# Patient Record
Sex: Male | Born: 1950 | Race: White | Hispanic: No | Marital: Married | Smoking: Former smoker
Health system: Southern US, Community
[De-identification: ages and names within clinical notes are randomized; demographics above are authoritative.]

## PROBLEM LIST (undated history)

## (undated) DIAGNOSIS — C3490 Malignant neoplasm of unspecified part of unspecified bronchus or lung: Secondary | ICD-10-CM

## (undated) DIAGNOSIS — G500 Trigeminal neuralgia: Secondary | ICD-10-CM

## (undated) HISTORY — PX: OTHER SURGICAL HISTORY: SHX169

---

## 2015-05-31 ENCOUNTER — Ambulatory Visit
Admission: RE | Admit: 2015-05-31 | Discharge: 2015-05-31 | Disposition: A | Discharge status: Home / Self Care | Payer: Disability Insurance | Source: Ambulatory Visit | Attending: Physical Medicine and Rehabilitation | Admitting: Physical Medicine and Rehabilitation

## 2015-05-31 ENCOUNTER — Other Ambulatory Visit: Payer: Self-pay | Admitting: Physical Medicine and Rehabilitation

## 2015-05-31 DIAGNOSIS — M170 Bilateral primary osteoarthritis of knee: Secondary | ICD-10-CM | POA: Insufficient documentation

## 2015-05-31 DIAGNOSIS — G8929 Other chronic pain: Secondary | ICD-10-CM | POA: Diagnosis present

## 2015-05-31 DIAGNOSIS — M1611 Unilateral primary osteoarthritis, right hip: Secondary | ICD-10-CM

## 2016-01-31 ENCOUNTER — Ambulatory Visit
Admission: RE | Admit: 2016-01-31 | Discharge: 2016-01-31 | Disposition: A | Discharge status: Home / Self Care | Payer: Disability Insurance | Source: Ambulatory Visit | Attending: General Practice | Admitting: General Practice

## 2016-01-31 ENCOUNTER — Other Ambulatory Visit: Payer: Self-pay | Admitting: General Practice

## 2016-01-31 DIAGNOSIS — M1990 Unspecified osteoarthritis, unspecified site: Secondary | ICD-10-CM

## 2016-01-31 DIAGNOSIS — M25562 Pain in left knee: Secondary | ICD-10-CM | POA: Diagnosis present

## 2016-01-31 DIAGNOSIS — G8929 Other chronic pain: Secondary | ICD-10-CM | POA: Insufficient documentation

## 2016-01-31 DIAGNOSIS — M545 Low back pain: Secondary | ICD-10-CM | POA: Insufficient documentation

## 2016-01-31 DIAGNOSIS — I700 Atherosclerosis of aorta: Secondary | ICD-10-CM | POA: Insufficient documentation

## 2016-01-31 DIAGNOSIS — M5136 Other intervertebral disc degeneration, lumbar region: Secondary | ICD-10-CM | POA: Diagnosis not present

## 2016-01-31 DIAGNOSIS — M170 Bilateral primary osteoarthritis of knee: Secondary | ICD-10-CM | POA: Diagnosis not present

## 2016-01-31 DIAGNOSIS — M25561 Pain in right knee: Secondary | ICD-10-CM | POA: Diagnosis present

## 2016-07-31 ENCOUNTER — Inpatient Hospital Stay: Payer: Medicaid Other

## 2016-07-31 ENCOUNTER — Inpatient Hospital Stay: Admit: 2016-07-31 | Payer: Medicaid Other

## 2016-07-31 ENCOUNTER — Encounter: Payer: Self-pay | Admitting: Emergency Medicine

## 2016-07-31 ENCOUNTER — Inpatient Hospital Stay
Admission: EM | Admit: 2016-07-31 | Discharge: 2016-08-06 | DRG: 917 | Disposition: E | Payer: Medicaid Other | Attending: Internal Medicine | Admitting: Internal Medicine

## 2016-07-31 ENCOUNTER — Emergency Department: Payer: Medicaid Other

## 2016-07-31 DIAGNOSIS — T402X2A Poisoning by other opioids, intentional self-harm, initial encounter: Secondary | ICD-10-CM | POA: Diagnosis present

## 2016-07-31 DIAGNOSIS — Z833 Family history of diabetes mellitus: Secondary | ICD-10-CM

## 2016-07-31 DIAGNOSIS — T50902D Poisoning by unspecified drugs, medicaments and biological substances, intentional self-harm, subsequent encounter: Secondary | ICD-10-CM | POA: Diagnosis not present

## 2016-07-31 DIAGNOSIS — Z923 Personal history of irradiation: Secondary | ICD-10-CM

## 2016-07-31 DIAGNOSIS — J9620 Acute and chronic respiratory failure, unspecified whether with hypoxia or hypercapnia: Secondary | ICD-10-CM | POA: Diagnosis not present

## 2016-07-31 DIAGNOSIS — T50902A Poisoning by unspecified drugs, medicaments and biological substances, intentional self-harm, initial encounter: Secondary | ICD-10-CM

## 2016-07-31 DIAGNOSIS — K219 Gastro-esophageal reflux disease without esophagitis: Secondary | ICD-10-CM | POA: Diagnosis present

## 2016-07-31 DIAGNOSIS — C787 Secondary malignant neoplasm of liver and intrahepatic bile duct: Secondary | ICD-10-CM | POA: Diagnosis present

## 2016-07-31 DIAGNOSIS — G894 Chronic pain syndrome: Secondary | ICD-10-CM | POA: Diagnosis present

## 2016-07-31 DIAGNOSIS — Z87891 Personal history of nicotine dependence: Secondary | ICD-10-CM | POA: Diagnosis not present

## 2016-07-31 DIAGNOSIS — C781 Secondary malignant neoplasm of mediastinum: Secondary | ICD-10-CM | POA: Diagnosis present

## 2016-07-31 DIAGNOSIS — Z515 Encounter for palliative care: Secondary | ICD-10-CM | POA: Diagnosis not present

## 2016-07-31 DIAGNOSIS — I4891 Unspecified atrial fibrillation: Secondary | ICD-10-CM | POA: Diagnosis present

## 2016-07-31 DIAGNOSIS — F329 Major depressive disorder, single episode, unspecified: Secondary | ICD-10-CM | POA: Diagnosis present

## 2016-07-31 DIAGNOSIS — J9621 Acute and chronic respiratory failure with hypoxia: Secondary | ICD-10-CM | POA: Diagnosis not present

## 2016-07-31 DIAGNOSIS — C3490 Malignant neoplasm of unspecified part of unspecified bronchus or lung: Secondary | ICD-10-CM | POA: Diagnosis not present

## 2016-07-31 DIAGNOSIS — J9622 Acute and chronic respiratory failure with hypercapnia: Secondary | ICD-10-CM | POA: Diagnosis not present

## 2016-07-31 DIAGNOSIS — I350 Nonrheumatic aortic (valve) stenosis: Secondary | ICD-10-CM | POA: Diagnosis not present

## 2016-07-31 DIAGNOSIS — C786 Secondary malignant neoplasm of retroperitoneum and peritoneum: Secondary | ICD-10-CM | POA: Diagnosis present

## 2016-07-31 DIAGNOSIS — I480 Paroxysmal atrial fibrillation: Secondary | ICD-10-CM | POA: Diagnosis present

## 2016-07-31 DIAGNOSIS — J9600 Acute respiratory failure, unspecified whether with hypoxia or hypercapnia: Secondary | ICD-10-CM

## 2016-07-31 DIAGNOSIS — N400 Enlarged prostate without lower urinary tract symptoms: Secondary | ICD-10-CM | POA: Diagnosis present

## 2016-07-31 DIAGNOSIS — G500 Trigeminal neuralgia: Secondary | ICD-10-CM | POA: Diagnosis present

## 2016-07-31 DIAGNOSIS — R627 Adult failure to thrive: Secondary | ICD-10-CM | POA: Diagnosis present

## 2016-07-31 DIAGNOSIS — Z66 Do not resuscitate: Secondary | ICD-10-CM | POA: Diagnosis not present

## 2016-07-31 DIAGNOSIS — B850 Pediculosis due to Pediculus humanus capitis: Secondary | ICD-10-CM | POA: Diagnosis present

## 2016-07-31 DIAGNOSIS — R0902 Hypoxemia: Secondary | ICD-10-CM

## 2016-07-31 DIAGNOSIS — C7989 Secondary malignant neoplasm of other specified sites: Secondary | ICD-10-CM | POA: Diagnosis present

## 2016-07-31 DIAGNOSIS — R0600 Dyspnea, unspecified: Secondary | ICD-10-CM

## 2016-07-31 DIAGNOSIS — I2699 Other pulmonary embolism without acute cor pulmonale: Secondary | ICD-10-CM | POA: Diagnosis present

## 2016-07-31 DIAGNOSIS — C3402 Malignant neoplasm of left main bronchus: Secondary | ICD-10-CM | POA: Diagnosis present

## 2016-07-31 DIAGNOSIS — Z6828 Body mass index (BMI) 28.0-28.9, adult: Secondary | ICD-10-CM

## 2016-07-31 DIAGNOSIS — E43 Unspecified severe protein-calorie malnutrition: Secondary | ICD-10-CM | POA: Diagnosis present

## 2016-07-31 DIAGNOSIS — T50901A Poisoning by unspecified drugs, medicaments and biological substances, accidental (unintentional), initial encounter: Secondary | ICD-10-CM | POA: Diagnosis present

## 2016-07-31 DIAGNOSIS — F4325 Adjustment disorder with mixed disturbance of emotions and conduct: Secondary | ICD-10-CM | POA: Diagnosis present

## 2016-07-31 DIAGNOSIS — T1491XA Suicide attempt, initial encounter: Secondary | ICD-10-CM

## 2016-07-31 DIAGNOSIS — R609 Edema, unspecified: Secondary | ICD-10-CM

## 2016-07-31 HISTORY — DX: Malignant neoplasm of unspecified part of unspecified bronchus or lung: C34.90

## 2016-07-31 HISTORY — DX: Trigeminal neuralgia: G50.0

## 2016-07-31 LAB — APTT: APTT: 38 s — AB (ref 24–36)

## 2016-07-31 LAB — BLOOD GAS, VENOUS
Acid-base deficit: 2.4 mmol/L — ABNORMAL HIGH (ref 0.0–2.0)
BICARBONATE: 25 mmol/L (ref 20.0–28.0)
FIO2: 1
O2 Saturation: 89.3 %
PH VEN: 7.29 (ref 7.250–7.430)
Patient temperature: 37
pCO2, Ven: 52 mmHg (ref 44.0–60.0)
pO2, Ven: 64 mmHg — ABNORMAL HIGH (ref 32.0–45.0)

## 2016-07-31 LAB — COMPREHENSIVE METABOLIC PANEL
ALBUMIN: 3.4 g/dL — AB (ref 3.5–5.0)
ALT: 120 U/L — AB (ref 17–63)
AST: 53 U/L — AB (ref 15–41)
Alkaline Phosphatase: 114 U/L (ref 38–126)
Anion gap: 16 — ABNORMAL HIGH (ref 5–15)
BUN: 20 mg/dL (ref 6–20)
CHLORIDE: 94 mmol/L — AB (ref 101–111)
CO2: 25 mmol/L (ref 22–32)
CREATININE: 1 mg/dL (ref 0.61–1.24)
Calcium: 9.6 mg/dL (ref 8.9–10.3)
GFR calc Af Amer: >60 mL/min (ref >60)
GLUCOSE: 222 mg/dL — AB (ref 65–99)
POTASSIUM: 3.7 mmol/L (ref 3.5–5.1)
SODIUM: 135 mmol/L (ref 135–145)
Total Bilirubin: 1.1 mg/dL (ref 0.3–1.2)
Total Protein: 7.6 g/dL (ref 6.5–8.1)

## 2016-07-31 LAB — SALICYLATE LEVEL: Salicylate Lvl: <7 mg/dL (ref 2.8–30.0)

## 2016-07-31 LAB — TROPONIN I

## 2016-07-31 LAB — LACTIC ACID, PLASMA
LACTIC ACID, VENOUS: 2 mmol/L — AB (ref 0.5–1.9)
Lactic Acid, Venous: 2.3 mmol/L (ref 0.5–1.9)

## 2016-07-31 LAB — PROTIME-INR
INR: 2.09
Prothrombin Time: 23.8 seconds — ABNORMAL HIGH (ref 11.4–15.2)

## 2016-07-31 LAB — CBC
HCT: 46.5 % (ref 40.0–52.0)
HEMOGLOBIN: 15.3 g/dL (ref 13.0–18.0)
MCH: 27.6 pg (ref 26.0–34.0)
MCHC: 33 g/dL (ref 32.0–36.0)
MCV: 83.7 fL (ref 80.0–100.0)
PLATELETS: 308 10*3/uL (ref 150–440)
RBC: 5.55 MIL/uL (ref 4.40–5.90)
RDW: 14.7 % — ABNORMAL HIGH (ref 11.5–14.5)
WBC: 15.7 10*3/uL — AB (ref 3.8–10.6)

## 2016-07-31 LAB — ACETAMINOPHEN LEVEL

## 2016-07-31 LAB — ETHANOL: Alcohol, Ethyl (B): 7 mg/dL — ABNORMAL HIGH (ref <5)

## 2016-07-31 LAB — TSH: TSH: 1.205 u[IU]/mL (ref 0.350–4.500)

## 2016-07-31 MED ORDER — SERTRALINE HCL 50 MG PO TABS
150.0000 mg | ORAL_TABLET | Freq: Every day | ORAL | Status: DC
  Administered 2016-08-01: 150 mg via ORAL
  Filled 2016-07-31: qty 1

## 2016-07-31 MED ORDER — MIRTAZAPINE 15 MG PO TABS
15.0000 mg | ORAL_TABLET | Freq: Every day | ORAL | Status: DC
  Administered 2016-07-31: 15 mg via ORAL
  Filled 2016-07-31: qty 1

## 2016-07-31 MED ORDER — ACETAMINOPHEN 325 MG PO TABS: 650.0000 mg | ORAL_TABLET | Freq: Four times a day (QID) | ORAL | Status: DC | PRN

## 2016-07-31 MED ORDER — TAMSULOSIN HCL 0.4 MG PO CAPS
0.8000 mg | ORAL_CAPSULE | Freq: Every day | ORAL | Status: DC
  Administered 2016-07-31: 0.8 mg via ORAL
  Filled 2016-07-31: qty 2

## 2016-07-31 MED ORDER — ONDANSETRON HCL 4 MG/2ML IJ SOLN
4.0000 mg | Freq: Four times a day (QID) | INTRAMUSCULAR | Status: DC | PRN
  Administered 2016-07-31: 4 mg via INTRAVENOUS
  Filled 2016-07-31: qty 2

## 2016-07-31 MED ORDER — ENOXAPARIN SODIUM 100 MG/ML ~~LOC~~ SOLN
1.0000 mg/kg | Freq: Two times a day (BID) | SUBCUTANEOUS | Status: DC
  Administered 2016-07-31 – 2016-08-01 (×2): 95 mg via SUBCUTANEOUS
  Filled 2016-07-31 (×2): qty 1

## 2016-07-31 MED ORDER — ACETAMINOPHEN 650 MG RE SUPP: 650.0000 mg | Freq: Four times a day (QID) | RECTAL | Status: DC | PRN

## 2016-07-31 MED ORDER — SODIUM CHLORIDE 0.9 % IV SOLN
INTRAVENOUS | Status: DC
  Administered 2016-07-31 – 2016-08-01 (×2): via INTRAVENOUS

## 2016-07-31 MED ORDER — DILTIAZEM HCL 25 MG/5ML IV SOLN
5.0000 mg | Freq: Four times a day (QID) | INTRAVENOUS | Status: DC | PRN
  Administered 2016-08-01: 5 mg via INTRAVENOUS

## 2016-07-31 MED ORDER — METOPROLOL TARTRATE 25 MG PO TABS
25.0000 mg | ORAL_TABLET | Freq: Two times a day (BID) | ORAL | Status: DC
  Administered 2016-07-31 – 2016-08-01 (×2): 25 mg via ORAL
  Filled 2016-07-31 (×3): qty 1

## 2016-07-31 MED ORDER — HEPARIN (PORCINE) IN NACL 100-0.45 UNIT/ML-% IJ SOLN
1100.0000 [IU]/h | Freq: Once | INTRAMUSCULAR | Status: AC
  Administered 2016-07-31: 1100 [IU]/h via INTRAVENOUS
  Filled 2016-07-31: qty 250

## 2016-07-31 MED ORDER — HEPARIN (PORCINE) IN NACL 100-0.45 UNIT/ML-% IJ SOLN
1100.0000 [IU]/h | INTRAMUSCULAR | Status: DC
  Filled 2016-07-31: qty 250

## 2016-07-31 MED ORDER — ONDANSETRON HCL 4 MG PO TABS: 4.0000 mg | ORAL_TABLET | Freq: Four times a day (QID) | ORAL | Status: DC | PRN

## 2016-07-31 MED ORDER — IOPAMIDOL (ISOVUE-370) INJECTION 76%
75.0000 mL | Freq: Once | INTRAVENOUS | Status: AC | PRN
  Administered 2016-07-31: 75 mL via INTRAVENOUS

## 2016-07-31 MED ORDER — ENOXAPARIN SODIUM 40 MG/0.4ML ~~LOC~~ SOLN: 40.0000 mg | SUBCUTANEOUS | Status: DC

## 2016-07-31 MED ORDER — DILTIAZEM HCL 25 MG/5ML IV SOLN
20.0000 mg | Freq: Once | INTRAVENOUS | Status: AC
  Administered 2016-07-31: 20 mg via INTRAVENOUS
  Administered 2016-08-01: 10 mg via INTRAVENOUS
  Filled 2016-07-31: qty 5

## 2016-07-31 MED ORDER — AMIODARONE HCL IN DEXTROSE 360-4.14 MG/200ML-% IV SOLN: 30.0000 mg/h | INTRAVENOUS | Status: DC

## 2016-07-31 MED ORDER — AMIODARONE HCL IN DEXTROSE 360-4.14 MG/200ML-% IV SOLN
60.0000 mg/h | INTRAVENOUS | Status: DC
  Filled 2016-07-31: qty 200

## 2016-07-31 MED ORDER — SERTRALINE HCL 100 MG PO TABS: 100.0000 mg | ORAL_TABLET | Freq: Every day | ORAL | Status: DC

## 2016-07-31 MED ORDER — PERMETHRIN 5 % EX CREA
TOPICAL_CREAM | Freq: Once | CUTANEOUS | Status: AC
  Administered 2016-07-31: 17:00:00 via TOPICAL
  Filled 2016-07-31: qty 60

## 2016-07-31 MED ORDER — CARBAMAZEPINE 200 MG PO TABS
200.0000 mg | ORAL_TABLET | Freq: Three times a day (TID) | ORAL | Status: DC
  Administered 2016-07-31 – 2016-08-01 (×3): 200 mg via ORAL
  Filled 2016-07-31 (×4): qty 1

## 2016-07-31 MED ORDER — SODIUM CHLORIDE 0.9 % IV SOLN
Freq: Once | INTRAVENOUS | Status: AC
  Administered 2016-07-31: 1000 mL via INTRAVENOUS

## 2016-07-31 MED ORDER — AMIODARONE LOAD VIA INFUSION
150.0000 mg | Freq: Once | INTRAVENOUS | Status: DC
  Filled 2016-07-31: qty 83.34

## 2016-07-31 MED ORDER — DILTIAZEM HCL 100 MG IV SOLR
5.0000 mg/h | Freq: Once | INTRAVENOUS | Status: DC
  Filled 2016-07-31: qty 100

## 2016-07-31 MED ORDER — PANTOPRAZOLE SODIUM 40 MG PO TBEC
40.0000 mg | DELAYED_RELEASE_TABLET | Freq: Every day | ORAL | Status: DC
  Administered 2016-07-31 – 2016-08-01 (×2): 40 mg via ORAL
  Filled 2016-07-31 (×2): qty 1

## 2016-07-31 MED ORDER — AMIODARONE LOAD VIA INFUSION: 150.0000 mg | Freq: Once | INTRAVENOUS | Status: DC

## 2016-07-31 NOTE — ED Notes (Signed)
Pt currently being treated for stage 4 lung CA, completed radiation about two weeks ago and was supposed to go to [facility] today to discuss starting chemo.

## 2016-07-31 NOTE — ED Notes (Signed)
Patient transported to Ultrasound 

## 2016-07-31 NOTE — Consult Note (Signed)
[facility] Consult   Reason for Consult:  Consult for this 65 year old man who came in to the hospital after an intentional suicide attempt by overdose of narcotics Referring Physician:  Tressia Miners Patient Identification: Douglas Shields MRN:  [id number] Principal Diagnosis: Adjustment disorder with mixed disturbance of emotions and conduct Diagnosis:   Patient Active Problem List   Diagnosis Date Noted  . Overdose [T50.901A] 07/29/2016  . Adjustment disorder with mixed disturbance of emotions and conduct [F43.25] 07/27/2016  . Suicide attempt [T14.91XA] 07/28/2016  . Chronic pain syndrome [G89.4] 07/28/2016    Total Time spent with patient: 1 hour  Subjective:   Douglas Shields is a 65 y.o. male patient admitted with "I mostly feel stupid".  HPI:  Patient interviewed. Chart reviewed. Labs and vitals reviewed. Case reviewed with hospitalist. Spoke with his family briefly as well. 65 year old man presented to the emergency room today after intentionally overdosing on his narcotic pain medicine. This morning when his wife woke up she noticed that he seemed to be mentally different than usual. She discovered that his entire bottle of morphine was gone and he admitted having overdosed on it. Patient tells me that between 11 and 12 last night he took his entire bottle of morphine sulfate probably about 70-75 tablets of 15 mg strength. He says he did this with the intention that he was going to die. He had been thinking about it for a couple of days. He is tired of the misery of his sickness and felt like his family would be better off if he were to die. Patient has been feeling down and negative ever since being treated for lung cancer. He describes his pain as being chronic and his sickness as being miserable. Can't sleep at night. Coughing constantly during the day. Worries about the suffering his family is going through. Patient denies any psychotic symptoms. Appetite has  been decreased. No homicidal ideation. He is prescribed Zoloft 100 mg a day which is a chronic medicine started for anxiety symptoms years ago. Patient is suffering with lung cancer and has had both surgery and radiation treatment and is very symptomatic. He is uncertain as to what the status of his cancer is.  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 He is married. Has adult children as well as grandchildren and describes his relationship with all of them is good.  Medical history: Lung cancer. Unclear what the status is. He has said that he does not want to get any further evaluation at this point because he feels too sick. Also has a history of trigeminal neuralgia.  Substance abuse history: Says that he drank a bit in his early 72s but since then has never had an alcohol problem no history of any other substance abuse at all.  Past Psychiatric History: Patient was treated for anxiety symptoms several years ago. The way he describes it it sounds like he was having panic attacks that were waking him up at night. He was treated with Zoloft and has been pretty much under control now for a few years. No history of hospitalization. No prior suicide attempts.  Risk to Self: Is patient at risk for suicide?: Yes Risk to Others:   Prior Inpatient Therapy:   Prior Outpatient Therapy:    Past Medical History:  Past Medical History:  Diagnosis Date  . Lung cancer ([place])   . Trigeminal neuralgia  Past Surgical History:  Procedure Laterality Date  . biopsy for lung cancer     Family History:  Family History  Problem Relation Age of Onset  . Diabetes Mother   . Diabetes Father    Family Psychiatric  History: Does not know of any family history of mental illness Social History:  History  Alcohol Use No     History  Drug use: Unknown    Social History   Social History  . Marital status: Married     Spouse name: N/A  . Number of children: N/A  . Years of education: N/A   Social History Main Topics  . Smoking status: Former Smoker    Years: 60.00  . Smokeless tobacco: Never Used     Comment: quit 2 months ago  . Alcohol use No  . Drug use: Unknown  . Sexual activity: Not Asked   Other Topics Concern  . None   Social History Narrative   Lives at home with family.   Additional Social History:    Allergies:  No Known Allergies  Labs:  Results for orders placed or performed during the hospital encounter of 07/26/2016 (from the past 48 hour(s))  CBC     Status: Abnormal   Collection Time: 07/22/2016 10:27 AM  Result Value Ref Range   WBC 15.7 (H) 3.8 - 10.6 K/uL   RBC 5.55 4.40 - 5.90 MIL/uL   Hemoglobin 15.3 13.0 - 18.0 g/dL   HCT 46.5 40.0 - 52.0 %   MCV 83.7 80.0 - 100.0 fL   MCH 27.6 26.0 - 34.0 pg   MCHC 33.0 32.0 - 36.0 g/dL   RDW 14.7 (H) 11.5 - 14.5 %   Platelets 308 150 - 440 K/uL  Troponin I     Status: None   Collection Time: 07/22/2016 10:27 AM  Result Value Ref Range   Troponin I <0.03 <0.03 ng/mL  Comprehensive metabolic panel     Status: Abnormal   Collection Time: 07/19/2016 10:27 AM  Result Value Ref Range   Sodium 135 135 - 145 mmol/L   Potassium 3.7 3.5 - 5.1 mmol/L   Chloride 94 (L) 101 - 111 mmol/L   CO2 25 22 - 32 mmol/L   Glucose, Bld 222 (H) 65 - 99 mg/dL   BUN 20 6 - 20 mg/dL   Creatinine, Ser 1.00 0.61 - 1.24 mg/dL   Calcium 9.6 8.9 - 10.3 mg/dL   Total Protein 7.6 6.5 - 8.1 g/dL   Albumin 3.4 (L) 3.5 - 5.0 g/dL   AST 53 (H) 15 - 41 U/L   ALT 120 (H) 17 - 63 U/L   Alkaline Phosphatase 114 38 - 126 U/L   Total Bilirubin 1.1 0.3 - 1.2 mg/dL   GFR calc non Af Amer >60 >60 mL/min   GFR calc Af Amer >60 >60 mL/min    Comment: (NOTE) The eGFR has been calculated using the CKD EPI equation. This calculation has not been validated in all clinical situations. eGFR's persistently <60 mL/min signify possible Chronic Kidney Disease.    Anion  gap 16 (H) 5 - 15  Acetaminophen level     Status: Abnormal   Collection Time: 07/11/2016 10:27 AM  Result Value Ref Range   Acetaminophen (Tylenol), Serum <10 (L) 10 - 30 ug/mL    Comment:        THERAPEUTIC CONCENTRATIONS VARY SIGNIFICANTLY. A RANGE OF 10-30 ug/mL MAY BE AN EFFECTIVE CONCENTRATION FOR MANY PATIENTS. HOWEVER, SOME ARE BEST TREATED  AT CONCENTRATIONS OUTSIDE THIS RANGE. ACETAMINOPHEN CONCENTRATIONS >150 ug/mL AT 4 HOURS AFTER INGESTION AND >50 ug/mL AT 12 HOURS AFTER INGESTION ARE OFTEN ASSOCIATED WITH TOXIC REACTIONS.   Salicylate level     Status: None   Collection Time: 08/03/2016 10:27 AM  Result Value Ref Range   Salicylate Lvl <9.4 2.8 - 30.0 mg/dL  Ethanol     Status: Abnormal   Collection Time: 07/07/2016 10:27 AM  Result Value Ref Range   Alcohol, Ethyl (B) 7 (H) <5 mg/dL    Comment:        LOWEST DETECTABLE LIMIT FOR SERUM ALCOHOL IS 5 mg/dL FOR MEDICAL PURPOSES ONLY   APTT     Status: Abnormal   Collection Time: 08/01/2016 10:27 AM  Result Value Ref Range   aPTT 38 (H) 24 - 36 seconds    Comment:        IF BASELINE aPTT IS ELEVATED, SUGGEST PATIENT RISK ASSESSMENT BE USED TO DETERMINE APPROPRIATE ANTICOAGULANT THERAPY.   Protime-INR     Status: Abnormal   Collection Time: 07/21/2016 10:27 AM  Result Value Ref Range   Prothrombin Time 23.8 (H) 11.4 - 15.2 seconds   INR 2.09   Lactic acid, plasma     Status: Abnormal   Collection Time: 08/04/2016 11:35 AM  Result Value Ref Range   Lactic Acid, Venous 2.0 (HH) 0.5 - 1.9 mmol/L    Comment: CRITICAL RESULT CALLED TO, READ BACK BY AND VERIFIED WITH KENDALL MOFFITT AT 1344 07/30/2016 DAS   Blood gas, venous     Status: Abnormal   Collection Time: 08/04/2016 11:35 AM  Result Value Ref Range   FIO2 1.00    Delivery systems NON-REBREATHER OXYGEN MASK    pH, Ven 7.29 7.250 - 7.430   pCO2, Ven 52 44.0 - 60.0 mmHg   pO2, Ven 64.0 (H) 32.0 - 45.0 mmHg   Bicarbonate 25.0 20.0 - 28.0 mmol/L   Acid-base  deficit 2.4 (H) 0.0 - 2.0 mmol/L   O2 Saturation 89.3 %   Patient temperature 37.0    Collection site VENOUS    Sample type VENOUS   Lactic acid, plasma     Status: Abnormal   Collection Time: 07/07/2016  4:55 PM  Result Value Ref Range   Lactic Acid, Venous 2.3 (HH) 0.5 - 1.9 mmol/L    Comment: CRITICAL RESULT CALLED TO, READ BACK BY AND VERIFIED WITH [facility] AT 1810 07/25/2016 BY TFK.   TSH     Status: None   Collection Time: 07/11/2016  4:55 PM  Result Value Ref Range   TSH 1.205 0.350 - 4.500 uIU/mL    Comment: Performed by a 3rd Generation assay with a functional sensitivity of <=0.01 uIU/mL.    Current Facility-Administered Medications  Medication Dose Route Frequency Provider Last Rate Last Dose  . 0.9 %  sodium chloride infusion   Intravenous Continuous Gladstone Lighter, MD 75 mL/hr at 08/03/2016 1716    . acetaminophen (TYLENOL) tablet 650 mg  650 mg Oral Q6H PRN Gladstone Lighter, MD       Or  . acetaminophen (TYLENOL) suppository 650 mg  650 mg Rectal Q6H PRN Gladstone Lighter, MD      . carbamazepine (TEGRETOL) tablet 200 mg  200 mg Oral TID Gladstone Lighter, MD      . diltiazem (CARDIZEM) injection 5 mg  5 mg Intravenous Q6H PRN Gladstone Lighter, MD      . enoxaparin (LOVENOX) injection 95 mg  1 mg/kg Subcutaneous Q12H Sheema M  Hallaji, [place]      . metoprolol tartrate (LOPRESSOR) tablet 25 mg  25 mg Oral BID Gladstone Lighter, MD   25 mg at 07/06/2016 1717  . ondansetron (ZOFRAN) tablet 4 mg  4 mg Oral Q6H PRN Gladstone Lighter, MD       Or  . ondansetron (ZOFRAN) injection 4 mg  4 mg Intravenous Q6H PRN Gladstone Lighter, MD   4 mg at 07/26/2016 1711  . pantoprazole (PROTONIX) EC tablet 40 mg  40 mg Oral Daily Gladstone Lighter, MD   40 mg at 07/10/2016 1716  . [START ON 08/01/2016] sertraline (ZOLOFT) tablet 100 mg  100 mg Oral Daily Gladstone Lighter, MD      . tamsulosin (FLOMAX) capsule 0.8 mg  0.8 mg Oral QHS Gladstone Lighter, MD         Musculoskeletal: Strength & Muscle Tone: decreased Gait & Station: unable to stand Patient leans: N/A  Psychiatric Specialty Exam: Physical Exam  Nursing note and vitals reviewed. Constitutional: He appears well-developed and well-nourished. He appears distressed.  HENT:  Head: Normocephalic and atraumatic.  Eyes: Conjunctivae are normal. Pupils are equal, round, and reactive to light.  Neck: Normal range of motion.  Cardiovascular: Regular rhythm and normal heart sounds.   Respiratory: He is in respiratory distress.  GI: Soft.  Musculoskeletal:       Right shoulder: He exhibits decreased strength.  Neurological: He is alert.  Skin: Skin is warm and dry.  Psychiatric: His affect is blunt. His speech is delayed. He is slowed. Cognition and memory are normal. He expresses impulsivity. He expresses suicidal ideation.    Review of Systems  Constitutional: Positive for malaise/fatigue and weight loss.  HENT: Negative.   Eyes: Negative.   Respiratory: Positive for cough, sputum production and shortness of breath.   Cardiovascular: Positive for chest pain.  Gastrointestinal: Positive for abdominal pain.  Musculoskeletal: Negative.   Skin: Negative.   Neurological: Positive for weakness.  Psychiatric/Behavioral: Positive for depression and suicidal ideas. Negative for hallucinations, memory loss and substance abuse. The patient has insomnia. The patient is not nervous/anxious.     Blood pressure 114/74, pulse 97, temperature 98.4 F (36.9 C), resp. rate (!) 22, height 6' (1.829 m), weight 95.9 kg (211 lb 6.4 oz), SpO2 97 %.Body mass index is 28.67 kg/m.  General Appearance: Disheveled  Eye Contact:  Fair  Speech:  Slow  Volume:  Decreased  Mood:  Dysphoric  Affect:  Blunt  Thought Process:  Goal Directed  Orientation:  Full (Time, Place, and Person)  Thought Content:  Logical  Suicidal Thoughts:  Yes.  with intent/plan  Homicidal Thoughts:  No  Memory:  Immediate;    Good Recent;   Good Remote;   Fair  Judgement:  Impaired  Insight:  Shallow  Psychomotor Activity:  Decreased  Concentration:  Concentration: Fair  Recall:  [employer] of Knowledge:  Fair  Language:  Fair  Akathisia:  No  Handed:  Right  AIMS (if indicated):     Assets:  Communication Skills Housing Resilience Social Support  ADL's:  Impaired  Cognition:  WNL  Sleep:        Treatment Plan Summary: Daily contact with patient to assess and evaluate symptoms and progress in treatment, Medication management and Plan This is a 65 year old man who made a intentional suicide effort last night in the context of pain and illness with his cancer. Patient today says he does not feel like acting on it again but mostly feels "  stupid". He is upset about how much it has disturbed his family. Patient appears sad and down. Unclear whether this is best described as a major depression or as a severe adjustment disorder. Patient is currently on the medical service. I am filing commitment paperwork to formalize the commitment taken out earlier by the hospitalist. I will try and talk with his wife and family and the patient more tomorrow. I don't think that he probably is going to benefit from inpatient psychiatric hospitalization and his sickness may preclude it anyway area he is probably still at some elevated risk of this kind of behavior in the future. I am going to increase his Zoloft and also start a low dose of mirtazapine at night to help with sleep and mood.  Disposition: Supportive therapy provided about ongoing stressors.  Alethia Berthold, MD 07/29/2016 6:33 PM

## 2016-07-31 NOTE — ED Notes (Addendum)
Patient self converted his rhythm from a-fib 157 to sinus tach at 110.  MD notified, amiodarone d/c. Patient denies any pain or discomfort at this time.  Second EKG captured.

## 2016-07-31 NOTE — Progress Notes (Signed)
Lactic Acid 2.3.  Dr. Tressia Miners made aware. No new orders at this time.

## 2016-07-31 NOTE — ED Notes (Signed)
[facility] for transfer [phone]

## 2016-07-31 NOTE — Progress Notes (Signed)
Lower extremity Dopplers have been ordered for bilateral lower extremity edema which is chronic per patient. However left popliteal acute nonocclusive DVT noted. -Change Lovenox to treatment dose.

## 2016-07-31 NOTE — Progress Notes (Signed)
ANTICOAGULATION CONSULT NOTE - Initial Consult  Pharmacy Consult for heparin Drip  Indication: atrial fibrillation  No Known Allergies  Patient Measurements: Height: 6' (182.9 cm) Weight: 200 lb (90.7 kg) IBW/kg (Calculated) : 77.6  Vital Signs: Temp: 99.1 F (37.3 C) (10/26 1025) Temp Source: Axillary (10/26 1025) BP: 113/80 (10/26 1300) Pulse Rate: 111 (10/26 1300)   Recent Labs  08/04/2016 1027  HGB 15.3  HCT 46.5  PLT 308  APTT 38*  LABPROT 23.8*  INR 2.09  CREATININE 1.00  TROPONINI <0.03    Estimated Creatinine Clearance: 83 mL/min (by C-G formula based on SCr of 1 mg/dL).   Medical History: Past Medical History:  Diagnosis Date  . Cancer ([place])   . Trigeminal neuralgia     Assessment: 65 yo male with intentional drug overdose and rapid A.fib. Pharmacy consulted for heparin monitoring. Patient was initiated on heparin in the ED at a rate of 12units/kg/hr without bolus.   Goal of Therapy:  Heparin level 0.3-0.7 units/ml Monitor platelets by anticoagulation protocol: Yes   Plan:  Baseline labs ordered prior to drip initiation.  Continue drip at Heparin 1100 units/hr.  Check anti-Xa level in 6 hours and daily while on heparin Continue to monitor H&H and platelets  Nancy Fetter, PharmD Clinical Pharmacist 07/26/2016 1:51 PM

## 2016-07-31 NOTE — H&P (Signed)
[facility] NAME: Douglas Shields    MR#:  [id number]  DATE OF BIRTH:  1951-04-08  DATE OF ADMISSION:  07/18/2016  PRIMARY CARE PHYSICIAN: Jacklynn Barnacle, MD   REQUESTING/REFERRING PHYSICIAN: Dr. Lenise Arena  CHIEF COMPLAINT:   Chief Complaint  Patient presents with  . Drug Overdose  . Suicide Attempt    HISTORY OF PRESENT ILLNESS:  Douglas Shields  is a 65 y.o. male with a known history of Smoking history, trigeminal neuralgia, recent diagnosis of lung cancer just finished radiation presents to hospital after he overdosed on extended release morphine. Patient is able to tell his history. Since his diagnosis of lung cancer 2 months ago, he says he has been depressed. Stop smoking. In spite of finishing radiation 3 weeks ago, he hasn't been feeling better. In fact he supposed to go to his oncologist today to discuss further treatment plan with chemotherapy. His appetite has been poor, his energy has been extremely low. Prior to this he was able to ambulate to the bathroom, but now has been using his urinal. He feels hopeless and has been thinking about harming himself. Last night around 11 PM, he acted on his thoughts. He has been on MS Contin for several years for his back pain. When he stays he took about 78 pills of 15 mg MS Contin. He said he was drowsy and went to sleep after that and does not remember until his wife woke him up this morning. She found out that he was hard to arouse and also saw the empty pill bottle and called EMS. By the time he was bringing him to the hospital, patient was awake and alert, just felt sick to his stomach. Patient was noted to be in atrial fibrillation and heart rate of 170 when he came in and spontaneously converted and currently in sinus tachycardia with heart rate of 110. Denies any chest pain or palpitations. No prior cardiac history.  PAST MEDICAL HISTORY:   Past Medical History:    Diagnosis Date  . Lung cancer ([place])   . Trigeminal neuralgia     PAST SURGICAL HISTORY:   Past Surgical History:  Procedure Laterality Date  . biopsy for lung cancer      SOCIAL HISTORY:   Social History  Substance Use Topics  . Smoking status: Former Smoker    Years: 60.00  . Smokeless tobacco: Never Used     Comment: quit 2 months ago  . Alcohol use No    FAMILY HISTORY:   Family History  Problem Relation Age of Onset  . Diabetes Mother   . Diabetes Father     DRUG ALLERGIES:  No Known Allergies  REVIEW OF SYSTEMS:   Review of Systems  Constitutional: Positive for malaise/fatigue and weight loss. Negative for chills and fever.  HENT: Negative for ear discharge, ear pain, hearing loss and nosebleeds.   Eyes: Negative for blurred vision, double vision and photophobia.  Respiratory: Positive for shortness of breath. Negative for cough, hemoptysis and wheezing.   Cardiovascular: Positive for orthopnea and leg swelling. Negative for chest pain and palpitations.  Gastrointestinal: Positive for abdominal pain and nausea. Negative for constipation, diarrhea, heartburn, melena and vomiting.  Genitourinary: Negative for dysuria, frequency, hematuria and urgency.  Musculoskeletal: Negative for back pain, myalgias and neck pain.  Skin: Negative for rash.  Neurological: Negative for dizziness, tingling, sensory change, speech change, focal weakness and headaches.  Endo/Heme/Allergies: Does not bruise/bleed easily.  Psychiatric/Behavioral: Positive for depression and suicidal ideas.    MEDICATIONS AT HOME:   Prior to Admission medications   Medication Sig Start Date End Date Taking? Authorizing Provider  albuterol (PROVENTIL HFA;VENTOLIN HFA) 108 (90 Base) MCG/ACT inhaler Inhale 2 puffs into the lungs every 4 (four) hours as needed. 05/07/16 08/05/16 Yes Historical Provider, MD  carbamazepine (TEGRETOL) 200 MG tablet Take 1 tablet by mouth 3 (three) times daily. 07/02/16   Yes Historical Provider, MD  DPH-Lido-AlHydr-MgHydr-[place] (FIRST-MOUTHWASH BLM MT) 10 mLs by Mouth Rinse route 4 (four) times daily as needed. 07/12/16  Yes Historical Provider, MD  morphine (MS CONTIN) 15 MG 12 hr tablet Take 15 mg by mouth 3 (three) times daily. 07/30/16  Yes Historical Provider, MD  omeprazole (PRILOSEC) 20 MG capsule Take 20 mg by mouth 2 (two) times daily. 05/07/16  Yes Historical Provider, MD  oxyCODONE-acetaminophen (PERCOCET/ROXICET) 5-325 MG tablet Take 1 tablet by mouth daily as needed for severe pain.   Yes Historical Provider, MD  sertraline (ZOLOFT) 100 MG tablet Take 1 tablet by mouth daily. 07/02/16  Yes Historical Provider, MD  tamsulosin (FLOMAX) 0.4 MG CAPS capsule Take 2 capsules by mouth at bedtime. 05/07/16  Yes Historical Provider, MD      VITAL SIGNS:  Blood pressure 127/79, pulse (!) 110, temperature 99.1 F (37.3 C), temperature source Axillary, resp. rate (!) 22, height 6' (1.829 m), weight 90.7 kg (200 lb), SpO2 92 %.  PHYSICAL EXAMINATION:   Physical Exam  GENERAL:  65 y.o.-year-old obese patient sitting in the bed with no acute distress. Appears weak and depressed.  EYES: Pupils equal, round, reactive to light and accommodation. No scleral icterus. Extraocular muscles intact.  HEENT: Head atraumatic, normocephalic. Oropharynx and nasopharynx clear.  NECK:  Supple, no jugular venous distention. No thyroid enlargement, no tenderness.  LUNGS: Normal breath sounds bilaterally, scattered wheezing and rhonchi, no rales or crepitation. No use of accessory muscles of respiration.  CARDIOVASCULAR: S1, S2 normal. No murmurs, rubs, or gallops.  ABDOMEN: Soft, nontender, nondistended. Bowel sounds present. No organomegaly or mass.  EXTREMITIES: 2-3+ edema of both feet extending to his legs. No  cyanosis, or clubbing.  NEUROLOGIC: Cranial nerves II through XII are intact. Muscle strength 5/5 in both upper extremities and 4/5 in both lower extremities. Sensation  intact. Gait not checked.  PSYCHIATRIC: The patient is alert and oriented x 3. Depressed, not tearful, lack of energy noted. Not making eye contact SKIN: No obvious rash, lesion, or ulcer.   LABORATORY PANEL:   CBC  Recent Labs Lab 07/29/2016 1027  WBC 15.7*  HGB 15.3  HCT 46.5  PLT 308   ------------------------------------------------------------------------------------------------------------------  Chemistries   Recent Labs Lab 08/04/2016 1027  NA 135  K 3.7  CL 94*  CO2 25  GLUCOSE 222*  BUN 20  CREATININE 1.00  CALCIUM 9.6  AST 53*  ALT 120*  ALKPHOS 114  BILITOT 1.1   ------------------------------------------------------------------------------------------------------------------  Cardiac Enzymes  Recent Labs Lab 07/11/2016 1027  TROPONINI <0.03   ------------------------------------------------------------------------------------------------------------------  RADIOLOGY:  Dg Chest Port 1 View  Result Date: 07/30/2016 CLINICAL DATA:  Atrial fibrillation.  Elevated blood pressure. EXAM: PORTABLE CHEST 1 VIEW COMPARISON:  None. FINDINGS: Innumerable bilateral pulmonary nodules are identified. One of the largest is in the right upper lobe measuring 2.4 cm. There is likely mediastinal lymphadenopathy. Small left effusion is noted. No pneumothorax or consolidative process. Heart size is normal. No focal bony abnormality. IMPRESSION: Innumerable bilateral pulmonary nodules consistent with metastatic disease. Small left  pleural effusion. Electronically Signed   By: Inge Rise M.D.   On: 07/09/2016 11:15    EKG:   Orders placed or performed during the hospital encounter of 08/04/2016  . ED EKG  . ED EKG  . EKG 12-Lead  . EKG 12-Lead  . EKG 12-Lead  . EKG 12-Lead  . EKG 12-Lead  . EKG 12-Lead    IMPRESSION AND PLAN:   Eino Whitner  is a 65 y.o. male with a known history of Smoking history, trigeminal neuralgia, recent diagnosis of lung cancer just  finished radiation presents to hospital after he overdosed on extended release morphine. Noted to be in A. fib with RVR on presentation.  #1 major depression with drug overdose-overdosed on MS Contin. Monitor on telemetry. Patient is completely alert and oriented at this time. -Half-life is usually less than 24 hours. And it has been 15 hours since ingestion. -Psychiatry consult. Suicide precautions and sedated at bedside. -Remains depressed.  #2 A. fib with RVR-new onset, check magnesium and TSH. -Spontaneously converted to normal sinus rhythm. -Start on low-dose metoprolol orally and IV Cardizem when necessary. -Monitor on telemetry. -Check echocardiogram  #3 lower extremity edema-chronic likely venous insufficiency. -Check echocardiogram. Keep the legs elevated and compression stockings.  #4 trigeminal neuralgia-continue Tegretol 3 times a day  #5 lung cancer-diagnosed in September 2017. Status post radiation just finish 3 weeks ago. -MRI brain negative. PET scan with possible liver lesions. Chest x-ray today with bilateral lung nodules. -Follow-up with [facility] oncology after discharge  #5 DVT prophylaxis-Lovenox   Physical therapy consult prior to discharge, due to his weakness.    All the records are reviewed and case discussed with ED provider. Management plans discussed with the patient, family and they are in agreement.  CODE STATUS: Full Code  TOTAL TIME TAKING CARE OF THIS PATIENT: 50 minutes.    Gladstone Lighter M.D on 07/10/2016 at 2:38 PM  Between 7am to 6pm - Pager - [phone]  After 6pm go to [email or web address] - password [email or web address]  Office  [phone]  CC: Primary care physician; Jacklynn Barnacle, MD

## 2016-07-31 NOTE — ED Notes (Signed)
Patient desating to 85% on 2 L [place].  Nasal cannula bumped to 5 L with only at 87%. Patient placed on 15L nonrebreather and MD notified.

## 2016-07-31 NOTE — ED Provider Notes (Signed)
Family also reports body lice. I will order a permethrin cream.   Earleen Newport, MD 07/30/2016 1314

## 2016-07-31 NOTE — Progress Notes (Signed)
A nurse made an order without patient's knowledge. Chaplain spoke with patient's family member who said that they were just admitted.

## 2016-07-31 NOTE — ED Notes (Signed)
MD at bedside. 

## 2016-07-31 NOTE — Progress Notes (Signed)
ANTICOAGULATION CONSULT NOTE - Initial Consult  Pharmacy Consult for enoxaparin  Indication: DVT  No Known Allergies  Patient Measurements: Height: 6' (182.9 cm) Weight: 211 lb 6.4 oz (95.9 kg) IBW/kg (Calculated) : 77.6  Vital Signs: Temp: 98.4 F (36.9 C) (10/26 1701) Temp Source: Axillary (10/26 1025) BP: 114/74 (10/26 1701) Pulse Rate: 97 (10/26 1701)  Labs:  Recent Labs  07/19/2016 1027  HGB 15.3  HCT 46.5  PLT 308  APTT 38*  LABPROT 23.8*  INR 2.09  CREATININE 1.00  TROPONINI <0.03    Estimated Creatinine Clearance: 90.8 mL/min (by C-G formula based on SCr of 1 mg/dL).   Medical History: Past Medical History:  Diagnosis Date  . Lung cancer ([place])   . Trigeminal neuralgia     Assessment: 65 yo male  With left popliteal acute nonocclusive DVT noted on lower extremity doppler.  Plan:  Will start patient on enoxaparin '95mg'$  every 12 hours.  Continue to monitor patient for signs and symptoms of bleeding.   Nancy Fetter, PharmD Clinical Pharmacist 07/08/2016 6:30 PM

## 2016-07-31 NOTE — ED Triage Notes (Signed)
Pt to ed from home via acems with reports of taking 78 tablets of Morphine '15mg'$  last night around 11pm. Ems reports afib on monitor with hr 160's, pt was given '10mg'$  cardizem IV en route to ed per edp orders over the phone. Pt arrives a/ox3 to ed with hr in the 140's, bp 138/101, pt denies any pain at this time.  edp and several other staff nurses at bedside on pt arrival. Pt placed on cardiac monitor with 2 liters via [place] at 93%. Continue to monitor.

## 2016-07-31 NOTE — Progress Notes (Signed)
Patient arrived to unit. A &O x4. No distress on 5L [place]. Cardiac monitor placed on patient and verified with [employer] NT. Denies pain at this time. C/o Nausea. Will give IV Zofran. Room secured and prepared for IVC. Sitter at bedside. Family advised of IVC policy. Skin assessment verified with Particia Nearing RN. NS started at 75 ml/hr. Elimite cream applied all over patient's body and hair combed with lice comb.  VSS. Patient oriented to room and surrounds. POC reviewed with patient. Patient denies any needs at this time. Will continue to monitor.

## 2016-07-31 NOTE — Progress Notes (Signed)
Patients HR 120-140s and irregular at times. MD made aware. HR currently 99 and patient is in Sinus Rhythm. Ct angio ordered to rule out PE. Will pass along information to oncoming RN to closely monitor HR and give patient cardizem PRN if needed.

## 2016-07-31 NOTE — Progress Notes (Signed)
Patient ID: Douglas Shields, male   DOB: July 08, 1951, 65 y.o.   MRN: [id number]  Rec'd call from radiologist Dr. Gelene Mink that patient has RML acute PE with right heart strain.  Patient already anticoagulated with Lovenox for DVT.

## 2016-07-31 NOTE — ED Notes (Signed)
Admitting MD at bedside.

## 2016-07-31 NOTE — ED Provider Notes (Addendum)
[facility] Emergency Department Provider Note        Time seen: ----------------------------------------- 10:27 AM on 07/19/2016 -----------------------------------------    I have reviewed the triage vital signs and the nursing notes.   HISTORY  Chief Complaint Drug Overdose    HPI Douglas Shields is a 65 y.o. male who presents to ER being brought by EMS from home for drug overdose. Reportedly he took over 70 morphine 15 mg extended release tablets beginning at midnight last night. Patient states he was trying to kill himself. He does have lung cancer, typically treated at [facility].Patient noted to have rapid atrial fibrillation in route, he has not required any dose of Narcan. Patient does not have a history of atrial fibrillation. He denies any recent illness, has chronic shortness of breath and is on 4 L of oxygen all the time.   No past medical history on file.  There are no active problems to display for this patient.   No past surgical history on file.  Allergies Review of patient's allergies indicates not on file.  Social History Social History  Substance Use Topics  . Smoking status: Not on file  . Smokeless tobacco: Not on file  . Alcohol use Not on file    Review of Systems Constitutional: Negative for fever. Cardiovascular: Negative for chest pain. Respiratory: Positive for chronic shortness of breath Gastrointestinal: Negative for abdominal pain, vomiting and diarrhea. Genitourinary: Negative for dysuria. Musculoskeletal: Negative for back pain. Skin: Negative for rash. Neurological: Negative for headaches, focal weakness or numbness. Psychiatric: Positive for suicidal ideation  10-point ROS otherwise negative.  ____________________________________________   PHYSICAL EXAM:  VITAL SIGNS: ED Triage Vitals [07/09/2016 1025]  Enc Vitals Group     BP      Pulse      Resp (!) 26     Temp      Temp src      SpO2 92 %    Weight      Height      Head Circumference      Peak Flow      Pain Score      Pain Loc      Pain Edu?      Excl. in [employer]?     Constitutional: Alert and oriented. No acute distress Eyes: Conjunctivae are normal. PERRL. Normal extraocular movements. ENT   Head: Normocephalic and atraumatic.   Nose: No congestion/rhinnorhea.   Mouth/Throat: Mucous membranes are moist.   Neck: No stridor. Cardiovascular: Rapid rate, irregular rhythm. No murmurs, rubs, or gallops. Respiratory: Normal respiratory effort without tachypnea nor retractions. Rhonchi bilaterally Gastrointestinal: Soft and nontender. Normal bowel sounds Musculoskeletal: Nontender, Pitting edema bilaterally in lower extremities Neurologic:  Normal speech and language. No gross focal neurologic deficits are appreciated.  Skin:  Skin is warm, dry and intact. No rash noted. Psychiatric: Depressed mood and affect, positive for suicidal thought ____________________________________________  EKG: Interpreted by me.Atrial fibrillation with a rate of 130 bpm, normal QRS size, long QT, borderline ST depression. Normal axis.  Repeat EKG interpreted by me was sinus tachycardia, rate is 110 bpm, normal PR interval, normal QRS, normal QT, normal axis.  ____________________________________________  ED COURSE:  Pertinent labs & imaging results that were available during my care of the patient were reviewed by me and considered in my medical decision making (see chart for details). Clinical Course  Patient presents to the ER status post overdose with large doses of narcotics. Remarkably he is still alive if  he did take this much of his medication. We will assess from a cardiac standpoint, give medicines for rate control and observe in the ER. Eventually he will need psychiatric evaluation for suicidal ideation. CRITICAL CARE Performed by: Earleen Newport   Total critical care time: 30 minutes  Critical care time was  exclusive of separately billable procedures and treating other patients.  Critical care was necessary to treat or prevent imminent or life-threatening deterioration.  Critical care was time spent personally by me on the following activities: development of treatment plan with patient and/or surrogate as well as nursing, discussions with consultants, evaluation of patient's response to treatment, examination of patient, obtaining history from patient or surrogate, ordering and performing treatments and interventions, ordering and review of laboratory studies, ordering and review of radiographic studies, pulse oximetry and re-evaluation of patient's condition.   Procedures ____________________________________________   LABS (pertinent positives/negatives)  Labs Reviewed  CBC - Abnormal; Notable for the following:       Result Value   WBC 15.7 (*)    RDW 14.7 (*)    All other components within normal limits  COMPREHENSIVE METABOLIC PANEL - Abnormal; Notable for the following:    Chloride 94 (*)    Glucose, Bld 222 (*)    Albumin 3.4 (*)    AST 53 (*)    ALT 120 (*)    Anion gap 16 (*)    All other components within normal limits  ACETAMINOPHEN LEVEL - Abnormal; Notable for the following:    Acetaminophen (Tylenol), Serum <10 (*)    All other components within normal limits  ETHANOL - Abnormal; Notable for the following:    Alcohol, Ethyl (B) 7 (*)    All other components within normal limits  BLOOD GAS, VENOUS - Abnormal; Notable for the following:    pO2, Ven 64.0 (*)    Acid-base deficit 2.4 (*)    All other components within normal limits  APTT - Abnormal; Notable for the following:    aPTT 38 (*)    All other components within normal limits  TROPONIN I  SALICYLATE LEVEL  LACTIC ACID, PLASMA  LACTIC ACID, PLASMA  URINE DRUG SCREEN, QUALITATIVE ([facility] ONLY)  PROTIME-INR    RADIOLOGY Images were viewed by me  Chest x-ray IMPRESSION: Innumerable bilateral pulmonary  nodules consistent with metastatic disease.  Small left pleural effusion.  ____________________________________________  FINAL ASSESSMENT AND PLAN  Overdose, suicidal ideation, rapid atrial fibrillation  Plan: Patient with labs and imaging as dictated above. Patient briefly converted to a normal sinus rhythm, subsequently about 10 minutes later reverted back to rapid atrial fibrillation. We will use amiodarone as Cardizem seemed to have little if any effect on his heart rate. Patient and family have requested transfer to [facility] for continuity of care. Currently he is not suicidal due to his stage IV lung cancer. He appears medically stable for admission at this time.  [facility] is all full diversion, family is comfortable with being admitted here. Earleen Newport, MD   Note: This dictation was prepared with Dragon dictation. Any transcriptional errors that result from this process are unintentional    Earleen Newport, MD 07/29/2016 1239    Earleen Newport, MD 08/05/2016 1257

## 2016-08-01 ENCOUNTER — Inpatient Hospital Stay (HOSPITAL_COMMUNITY)
Admit: 2016-08-01 | Discharge: 2016-08-01 | Disposition: A | Discharge status: Home / Self Care | Payer: Medicaid Other | Attending: Internal Medicine | Admitting: Internal Medicine

## 2016-08-01 ENCOUNTER — Inpatient Hospital Stay: Payer: Medicaid Other

## 2016-08-01 DIAGNOSIS — C3490 Malignant neoplasm of unspecified part of unspecified bronchus or lung: Secondary | ICD-10-CM

## 2016-08-01 DIAGNOSIS — I350 Nonrheumatic aortic (valve) stenosis: Secondary | ICD-10-CM

## 2016-08-01 DIAGNOSIS — T50902D Poisoning by unspecified drugs, medicaments and biological substances, intentional self-harm, subsequent encounter: Secondary | ICD-10-CM

## 2016-08-01 DIAGNOSIS — J9620 Acute and chronic respiratory failure, unspecified whether with hypoxia or hypercapnia: Secondary | ICD-10-CM

## 2016-08-01 DIAGNOSIS — E43 Unspecified severe protein-calorie malnutrition: Secondary | ICD-10-CM | POA: Insufficient documentation

## 2016-08-01 LAB — URINALYSIS COMPLETE WITH MICROSCOPIC (ARMC ONLY)
BILIRUBIN URINE: NEGATIVE
Bacteria, UA: NONE SEEN
GLUCOSE, UA: NEGATIVE mg/dL
Ketones, ur: NEGATIVE mg/dL
LEUKOCYTES UA: NEGATIVE
Nitrite: NEGATIVE
Protein, ur: NEGATIVE mg/dL
Specific Gravity, Urine: 1.005 (ref 1.005–1.030)
pH: 5 (ref 5.0–8.0)

## 2016-08-01 LAB — CBC
HCT: 36.9 % — ABNORMAL LOW (ref 40.0–52.0)
HEMOGLOBIN: 12.6 g/dL — AB (ref 13.0–18.0)
MCH: 28.5 pg (ref 26.0–34.0)
MCHC: 34.2 g/dL (ref 32.0–36.0)
MCV: 83.5 fL (ref 80.0–100.0)
Platelets: 170 10*3/uL (ref 150–440)
RBC: 4.43 MIL/uL (ref 4.40–5.90)
RDW: 14.5 % (ref 11.5–14.5)
WBC: 8.6 10*3/uL (ref 3.8–10.6)

## 2016-08-01 LAB — BLOOD GAS, ARTERIAL
ACID-BASE EXCESS: 1 mmol/L (ref 0.0–2.0)
Acid-Base Excess: 2.7 mmol/L — ABNORMAL HIGH (ref 0.0–2.0)
Bicarbonate: 28.9 mmol/L — ABNORMAL HIGH (ref 20.0–28.0)
Bicarbonate: 26 mmol/L (ref 20.0–28.0)
DELIVERY SYSTEMS: POSITIVE
EXPIRATORY PAP: 10
FIO2: 0.44
FIO2: 0.4
Inspiratory PAP: 20
O2 SAT: 88.2 %
O2 Saturation: 80.2 %
PATIENT TEMPERATURE: 37
PCO2 ART: 42 mmHg (ref 32.0–48.0)
PH ART: 7.4 (ref 7.350–7.450)
Patient temperature: 37
pCO2 arterial: 50 mmHg — ABNORMAL HIGH (ref 32.0–48.0)
pH, Arterial: 7.37 (ref 7.350–7.450)
pO2, Arterial: 46 mmHg — ABNORMAL LOW (ref 83.0–108.0)
pO2, Arterial: 55 mmHg — ABNORMAL LOW (ref 83.0–108.0)

## 2016-08-01 LAB — BASIC METABOLIC PANEL
Anion gap: 9 (ref 5–15)
Anion gap: 15 (ref 5–15)
BUN: 21 mg/dL — ABNORMAL HIGH (ref 6–20)
BUN: 21 mg/dL — ABNORMAL HIGH (ref 6–20)
CHLORIDE: 98 mmol/L — AB (ref 101–111)
CO2: 30 mmol/L (ref 22–32)
CO2: 24 mmol/L (ref 22–32)
CREATININE: 0.74 mg/dL (ref 0.61–1.24)
Calcium: 8.8 mg/dL — ABNORMAL LOW (ref 8.9–10.3)
Calcium: 9 mg/dL (ref 8.9–10.3)
Chloride: 99 mmol/L — ABNORMAL LOW (ref 101–111)
Creatinine, Ser: 0.84 mg/dL (ref 0.61–1.24)
GFR calc Af Amer: >60 mL/min (ref >60)
GFR calc non Af Amer: >60 mL/min (ref >60)
GFR calc non Af Amer: >60 mL/min (ref >60)
Glucose, Bld: 143 mg/dL — ABNORMAL HIGH (ref 65–99)
Glucose, Bld: 206 mg/dL — ABNORMAL HIGH (ref 65–99)
Potassium: 4.1 mmol/L (ref 3.5–5.1)
Potassium: 3.7 mmol/L (ref 3.5–5.1)
Sodium: 137 mmol/L (ref 135–145)
Sodium: 138 mmol/L (ref 135–145)

## 2016-08-01 LAB — CBC WITH DIFFERENTIAL/PLATELET
Basophils Absolute: 0 10*3/uL (ref 0–0.1)
Basophils Relative: 0 %
Eosinophils Absolute: 0 10*3/uL (ref 0–0.7)
Eosinophils Relative: 0 %
HCT: 38 % — ABNORMAL LOW (ref 40.0–52.0)
Hemoglobin: 13.2 g/dL (ref 13.0–18.0)
Lymphocytes Relative: 4 %
Lymphs Abs: 0.4 10*3/uL — ABNORMAL LOW (ref 1.0–3.6)
MCH: 29.2 pg (ref 26.0–34.0)
MCHC: 34.8 g/dL (ref 32.0–36.0)
MCV: 83.8 fL (ref 80.0–100.0)
Monocytes Absolute: 0.5 10*3/uL (ref 0.2–1.0)
Monocytes Relative: 5 %
Neutro Abs: 8.2 10*3/uL — ABNORMAL HIGH (ref 1.4–6.5)
Neutrophils Relative %: 91 %
Platelets: 173 10*3/uL (ref 150–440)
RBC: 4.54 MIL/uL (ref 4.40–5.90)
RDW: 14.7 % — ABNORMAL HIGH (ref 11.5–14.5)
WBC: 9.1 10*3/uL (ref 3.8–10.6)

## 2016-08-01 LAB — APTT: aPTT: 61 seconds — ABNORMAL HIGH (ref 24–36)

## 2016-08-01 LAB — MRSA PCR SCREENING: MRSA by PCR: NEGATIVE

## 2016-08-01 LAB — HEPARIN LEVEL (UNFRACTIONATED): HEPARIN UNFRACTIONATED: 0.76 [IU]/mL — AB (ref 0.30–0.70)

## 2016-08-01 LAB — ECHOCARDIOGRAM COMPLETE
HEIGHTINCHES: 72 in
WEIGHTICAEL: 3382.4 [oz_av]

## 2016-08-01 LAB — PROCALCITONIN: Procalcitonin: 0.27 ng/mL

## 2016-08-01 LAB — LACTIC ACID, PLASMA: Lactic Acid, Venous: 1.9 mmol/L (ref 0.5–1.9)

## 2016-08-01 LAB — GLUCOSE, CAPILLARY: GLUCOSE-CAPILLARY: 189 mg/dL — AB (ref 65–99)

## 2016-08-01 MED ORDER — BOOST / RESOURCE BREEZE PO LIQD
1.0000 | Freq: Three times a day (TID) | ORAL | Status: DC
  Administered 2016-08-01: 1 via ORAL

## 2016-08-01 MED ORDER — LORAZEPAM 2 MG/ML IJ SOLN
2.0000 mg | INTRAMUSCULAR | Status: DC | PRN
  Administered 2016-08-01 – 2016-08-02 (×4): 2 mg via INTRAVENOUS
  Filled 2016-08-01 (×4): qty 1

## 2016-08-01 MED ORDER — HEPARIN (PORCINE) IN NACL 100-0.45 UNIT/ML-% IJ SOLN
1500.0000 [IU]/h | INTRAMUSCULAR | Status: DC
  Administered 2016-08-01: 1500 [IU]/h via INTRAVENOUS
  Filled 2016-08-01 (×2): qty 250

## 2016-08-01 MED ORDER — GLYCOPYRROLATE 1 MG PO TABS
1.0000 mg | ORAL_TABLET | ORAL | Status: DC | PRN
  Filled 2016-08-01: qty 1

## 2016-08-01 MED ORDER — GLYCOPYRROLATE 0.2 MG/ML IJ SOLN: 0.2000 mg | INTRAMUSCULAR | Status: DC | PRN

## 2016-08-01 MED ORDER — HEPARIN (PORCINE) IN NACL 100-0.45 UNIT/ML-% IJ SOLN
1500.0000 [IU]/kg/h | INTRAMUSCULAR | Status: DC
  Filled 2016-08-01 (×84): qty 250

## 2016-08-01 MED ORDER — LORAZEPAM 0.5 MG PO TABS: 1.0000 mg | ORAL_TABLET | ORAL | Status: DC | PRN

## 2016-08-01 MED ORDER — LORAZEPAM 2 MG/ML IJ SOLN
1.0000 mg | INTRAMUSCULAR | Status: DC | PRN
  Filled 2016-08-01: qty 1

## 2016-08-01 MED ORDER — HEPARIN BOLUS VIA INFUSION
2000.0000 [IU] | Freq: Once | INTRAVENOUS | Status: AC
  Administered 2016-08-01: 2000 [IU] via INTRAVENOUS
  Filled 2016-08-01: qty 2000

## 2016-08-01 MED ORDER — APIXABAN 5 MG PO TABS: 10.0000 mg | ORAL_TABLET | Freq: Two times a day (BID) | ORAL | Status: DC

## 2016-08-01 MED ORDER — MORPHINE 100MG IN NS 100ML (1MG/ML) PREMIX INFUSION
2.0000 mg/h | INTRAVENOUS | Status: DC
  Administered 2016-08-01: 2 mg/h via INTRAVENOUS
  Administered 2016-08-02 (×2): 10 mg/h via INTRAVENOUS
  Filled 2016-08-01 (×3): qty 100

## 2016-08-01 MED ORDER — FUROSEMIDE 10 MG/ML IJ SOLN: 40.0000 mg | Freq: Once | INTRAMUSCULAR | Status: DC

## 2016-08-01 MED ORDER — BUDESONIDE 0.5 MG/2ML IN SUSP
0.5000 mg | Freq: Two times a day (BID) | RESPIRATORY_TRACT | Status: DC
  Administered 2016-08-01: 0.5 mg via RESPIRATORY_TRACT
  Filled 2016-08-01: qty 2

## 2016-08-01 MED ORDER — LORAZEPAM 2 MG/ML PO CONC: 1.0000 mg | ORAL | Status: DC | PRN

## 2016-08-01 MED ORDER — LORAZEPAM 2 MG/ML PO CONC: 1.0000 mg | ORAL | Status: DC

## 2016-08-01 MED ORDER — IPRATROPIUM-ALBUTEROL 0.5-2.5 (3) MG/3ML IN SOLN
3.0000 mL | Freq: Four times a day (QID) | RESPIRATORY_TRACT | Status: DC
  Administered 2016-08-01: 3 mL via RESPIRATORY_TRACT
  Filled 2016-08-01: qty 3

## 2016-08-01 MED ORDER — LORAZEPAM 1 MG PO TABS: 1.0000 mg | ORAL_TABLET | ORAL | Status: DC | PRN

## 2016-08-01 MED ORDER — DOCUSATE SODIUM 100 MG PO CAPS: 100.0000 mg | ORAL_CAPSULE | Freq: Two times a day (BID) | ORAL | Status: DC | PRN

## 2016-08-01 MED ORDER — LORAZEPAM 2 MG/ML IJ SOLN
2.0000 mg | Freq: Once | INTRAMUSCULAR | Status: AC
  Administered 2016-08-01: 2 mg via INTRAVENOUS

## 2016-08-01 MED ORDER — LORAZEPAM 0.5 MG PO TABS: 1.0000 mg | ORAL_TABLET | ORAL | Status: DC

## 2016-08-01 MED ORDER — FUROSEMIDE 10 MG/ML IJ SOLN
INTRAMUSCULAR | Status: AC
  Filled 2016-08-01: qty 10

## 2016-08-01 MED ORDER — METHYLPREDNISOLONE SODIUM SUCC 125 MG IJ SOLR
60.0000 mg | Freq: Four times a day (QID) | INTRAMUSCULAR | Status: DC
  Administered 2016-08-01: 60 mg via INTRAVENOUS
  Filled 2016-08-01: qty 2

## 2016-08-01 MED ORDER — SENNA 8.6 MG PO TABS: 1.0000 | ORAL_TABLET | Freq: Two times a day (BID) | ORAL | Status: DC | PRN

## 2016-08-01 MED ORDER — APIXABAN 5 MG PO TABS: 5.0000 mg | ORAL_TABLET | Freq: Two times a day (BID) | ORAL | Status: DC

## 2016-08-01 MED ORDER — LORAZEPAM 2 MG/ML IJ SOLN: 2.0000 mg | INTRAMUSCULAR | Status: DC

## 2016-08-01 MED ORDER — DILTIAZEM HCL 25 MG/5ML IV SOLN
INTRAVENOUS | Status: AC
  Administered 2016-08-01: 10 mg via INTRAVENOUS
  Filled 2016-08-01: qty 5

## 2016-08-01 MED ORDER — DILTIAZEM HCL 25 MG/5ML IV SOLN: 10.0000 mg | Freq: Once | INTRAVENOUS | Status: AC

## 2016-08-01 MED ORDER — MORPHINE SULFATE 2 MG/ML IJ SOLN
2.0000 mg | INTRAMUSCULAR | Status: DC | PRN
  Administered 2016-08-01 – 2016-08-02 (×13): 2 mg via INTRAVENOUS
  Filled 2016-08-01 (×2): qty 1

## 2016-08-01 MED ORDER — GLYCOPYRROLATE 0.2 MG/ML IJ SOLN
0.2000 mg | INTRAMUSCULAR | Status: DC | PRN
  Administered 2016-08-01 – 2016-08-02 (×4): 0.2 mg via INTRAVENOUS
  Filled 2016-08-01 (×4): qty 1

## 2016-08-01 MED ORDER — SODIUM CHLORIDE 0.9 % IV SOLN: 2.0000 mg/h | INTRAVENOUS | Status: DC

## 2016-08-01 MED ORDER — ATROPINE SULFATE 1 % OP SOLN
2.0000 [drp] | Freq: Three times a day (TID) | OPHTHALMIC | Status: DC | PRN
  Filled 2016-08-01: qty 2

## 2016-08-01 NOTE — Progress Notes (Signed)
PT Cancellation Note  Patient Details Name: Douglas Shields MRN: [id number] DOB: [birth year]   Cancelled Treatment:    Reason Eval/Treat Not Completed: Medical issues which prohibited therapy  Pt. Found to have recently dx. PE 07/26/2016, previously started on anticoagulation for DVT in RLE on same date. Initial dose of heparin given 07/09/2016 13:40. Per policy will hold 48 hours from initial dose of anticoagulation to begin PT    [name] 08/01/2016, 7:58 AM

## 2016-08-01 NOTE — Evaluation (Addendum)
Clinical/Bedside Swallow Evaluation Patient Details  Name: [facility] MRN: [id number] Date of Birth: 05-02-1951  Today's Date: 08/01/2016 Time: SLP Start Time (ACUTE ONLY): 51 SLP Stop Time (ACUTE ONLY): 1330 SLP Time Calculation (min) (ACUTE ONLY): 60 min  Past Medical History:  Past Medical History:  Diagnosis Date  . Lung cancer ([place])   . Trigeminal neuralgia    Past Surgical History:  Past Surgical History:  Procedure Laterality Date  . biopsy for lung cancer     HPI:  Pt is a 65 y.o. male with a known history of smoking, trigeminal neuralgia, and recent diagnosis of lung cancer. He just finished some radiation tx and now presents to hospital after he overdosed on extended release morphine. He had presented with congestion and productive cough with progressive hemoptysis and a decline in his performance status to his Oncologist at [facility] [address]. He underwent bronchoscopy with revealed significant endobronchial disease with a marked distal component for which he underwent balloon dilatation as well as N3 disease. He had a thoracentesis performed at that time which was negative on cytology. His post-op course was complicated by dyspnea. He did undergo an initial course of radiation therapy to the chest. Since finishing radiation 3 weeks ago, he hasn't been feeling better. His appetite includes only bites of food and sips of liquid every few days - wife reported pt went 5 days w/out eating anything. He was supposed to go to his Oncologist on the day of this admission to discuss further treatment plan with chemotherapy. His appetite has been poor, his energy has been extremely low, he feels depressed per report. Prior to this he was able to ambulate to the bathroom, but now has been using his urinal. He feels hopeless and has been thinking about harming himself. Wife described pt's Pulmonary status as being quite declined d/t the lung Ca. She stated he has not eaten in ~4-5 weeks post txs at  [facility]. Pt was awake, alert and able to follow through w/ basic commands. He was dyspneic w/ stridor-like noise upon inhalation - also noted apnea moments b/t breaths intermittently b/t 5-6 seconds. Pt's vocal quality c/b gravely, breathiness - he spoke in a whisper 1-2 words at a time(declined breath support).    Assessment / Plan / Recommendation Clinical Impression  Pt appears at increased risk for aspiration d/t declined medical and Pulmonary status'. Pt's respiratory status is significantly declined w/ dyspneic and stridor-like noise upon inhalation at baseline - also noted apnea moments b/t breaths intermittently b/t 5-6 seconds. Pt's vocal quality c/b gravely, breathiness, dysphonia - he spoke in a whisper 1-2 words at a time(declined breath support. During po trials, pt exhibited fair swallow function abilities, however was much impacted by the lack of respiratory support for the exertion of the task of eating/drinking. With trials of ice chips and thin liquids via straw, pt swallowed and cleared trials w/ no immediate, overt s/s of aspiration noted but w/ continued dyspnea such as at baseline - strict monitoring of bolus amount and frequency was done to give rest breaks for pulmonary support. With trials of applesauce, pt appeared to orally hold these trials slightly in preparation of the swallow. Pt cleared appropriately given time. Due to concern for aspiration secondary to his significant Pulmonary decline, recommend a more pureed diet-broken down foods in order to lessen mastication effort. Recommend continuse thin liquids w/ cup holding support and monitoring bolus amount. Recommend strict aspiration precautions; Medications in puree (crushed as able). Would strongly consider holding po's  if any change in baseline respiratory status or suspicion of aspiration. Of note, wife stated pt's breathing has declined significantly ~4-5 weeks ago since the bronchoscopy at [facility] and the Radiation txs. Pt is at  increased risk for aspiration w/ oral intake and requires strict aspiration precautions w/ feeding supervision/monitoring. Pt appears unable to maintain nutrition/hydration support by mouth. Recommend holding any oral intake if pt does not appear to be tolerating the po's. Recommend discussion of alternative means of feeding as pt is at increased risk for aspiration d/t declined Pulmonary status and the need for nutrition/hydration support.     Aspiration Risk  Moderate aspiration risk;Risk for inadequate nutrition/hydration (d/t Pulmonary decline)    Diet Recommendation  Dysphagia level 2(more broken down for easier mastication and less demand on respiratory support); Thin liquids; strict aspiration precautions. Feeding assistance to lessen effort/exertion. Moisten foods well. Frequent rest breaks.   Medication Administration: Whole meds with puree (crushed if needed for easier, safely swallowing; as able)    Other  Recommendations Recommended Consults:  (Palliative Care consult; Dietician consult) Oral Care Recommendations: Oral care BID;Staff/trained caregiver to provide oral care   Follow up Recommendations None (TBD)      Frequency and Duration min 3x week  2 weeks       Prognosis Prognosis for Safe Diet Advancement: Guarded (-Fair) Barriers to Reach Goals: Severity of deficits      Swallow Study   General Date of Onset: 08/04/2016 HPI: Pt is a 65 y.o. male with a known history of smoking, trigeminal neuralgia, and recent diagnosis of lung cancer. He just finished some radiation tx and now presents to hospital after he overdosed on extended release morphine. He had presented with congestion and productive cough with progressive hemoptysis and a decline in his performance status to his Oncologist [facility]. He underwent bronchoscopy with revealed significant endobronchial disease with a marked distal component for which he underwent balloon dilatation as well as N3 disease. He had a  thoracentesis performed at that time which was negative on cytology. His post-op course was complicated by dyspnea. He did undergo an initial course of radiation therapy to the chest. Since finishing radiation 3 weeks ago, he hasn't been feeling better. His appetite includes only bites of food and sips of liquid every few days - wife reported pt went 5 days w/out eating anything. He was supposed to go to his Oncologist on the day of this admission to discuss further treatment plan with chemotherapy. His appetite has been poor, his energy has been extremely low, he feels depressed per report. Prior to this he was able to ambulate to the bathroom, but now has been using his urinal. He feels hopeless and has been thinking about harming himself. Wife described pt's Pulmonary status as being quite declined d/t the lung Ca. She stated he has not eaten in ~4-5 weeks post txs at [facility]. Pt was awake, alert and able to follow through w/ basic commands. He was dyspneic w/ stridor-like noise upon inhalation - also noted apnea moments b/t breaths intermittently b/t 5-6 seconds. Pt's vocal quality c/b gravely, breathiness - he spoke in a whisper 1-2 words at a time(declined breath support).  Type of Study: Bedside Swallow Evaluation Previous Swallow Assessment: none Diet Prior to this Study: Dysphagia 3 (soft);Thin liquids (wife stated he ate "jello, pears, appleasuce" at home) Temperature Spikes Noted: No (wbc 8.6) Respiratory Status: Nasal cannula (4 liters) History of Recent Intubation: No Behavior/Cognition: Alert;Cooperative;Pleasant mood;Requires cueing Oral Cavity Assessment: Dry Oral  Care Completed by SLP: Recent completion by staff Oral Cavity - Dentition: Missing dentition (few noted) Vision: Functional for self-feeding Self-Feeding Abilities: Able to feed self;Needs assist;Needs set up;Total assist (weak UEs) Patient Positioning: Upright in bed Baseline Vocal Quality: Breathy;Hoarse;Low vocal  intensity Volitional Cough: Congested Volitional Swallow: Able to elicit    Oral/Motor/Sensory Function Overall Oral Motor/Sensory Function: Within functional limits (grossly assessed d/t pt's pulmonary status)   Ice Chips Ice chips: Within functional limits Presentation: Spoon (fed; 3 trials) Other Comments: noted continued dyspnea before/after the trials - no significant increase   Thin Liquid Thin Liquid: Within functional limits (grossly) Presentation: Straw;Self Fed (assisted in holding cup; 9-10 trials total of juice/water) Other Comments: continued dyspnea such as at baseline - strict monitoring of bolus amount and frequency to give rest breaks for pulmonary support    Nectar Thick Nectar Thick Liquid: Not tested   Honey Thick Honey Thick Liquid: Not tested   Puree Puree: Impaired Presentation: Spoon (fed; 2 trials) Oral Phase Impairments:  (min bolus holding) Oral Phase Functional Implications: Prolonged oral transit;Oral holding (min) Pharyngeal Phase Impairments: Suspected delayed Swallow Other Comments: appeared to be preparation for swallowing/Apnea moment   Solid   GO   Solid: Not tested Other Comments: too much demand; currently declined respiratory status         Orinda Kenner, MS, CCC-SLP Watson,Katherine 08/01/2016,1:41 PM

## 2016-08-01 NOTE — Progress Notes (Signed)
Patient transferred from 2A. Report received from 2A nurse. Patient arrived on bipap at 40%, o2 sats in 90's. Patient had foley inserted per order, ABG drawn, labs drawn, heparin drip started, and urine collected. Family at beside, care discussed with NP. Comfort care orders received from NP, will start morphine.  Wilnette Kales

## 2016-08-01 NOTE — Progress Notes (Signed)
ANTICOAGULATION CONSULT NOTE - Follow Up Consult  Pharmacy Consult for Eliquis Indication: pulmonary embolus and DVT  No Known Allergies  Patient Measurements: Height: 6' (182.9 cm) Weight: 211 lb 6.4 oz (95.9 kg) IBW/kg (Calculated) : 77.6 Heparin Dosing Weight:    Vital Signs: Temp: 98 F (36.7 C) (10/27 0802) Temp Source: Oral (10/27 0416) BP: 117/57 (10/27 0802) Pulse Rate: 114 (10/27 0802)  Labs:  Recent Labs  07/27/2016 1027 08/01/16 0351  HGB 15.3 12.6*  HCT 46.5 36.9*  PLT 308 170  APTT 38*  --   LABPROT 23.8*  --   INR 2.09  --   CREATININE 1.00 0.74  TROPONINI <0.03  --     Estimated Creatinine Clearance: 113.5 mL/min (by C-G formula based on SCr of 0.74 mg/dL).   Assessment: Patient currently on Enoxaparin '1mg'$ /kg SQ q12h for DVT/PE. MD would like for patient to be transitioned over to [place]. Since patient is not eating very well due to poor appetite will go with Eliquis since it is not affected by food.  Plan:  Will order Eliquis '10mg'$  BID for 7 days followed by '5mg'$  bid. Will discontinue Enoxaparin order and will transition patient to Eliquis with this evenings dose.  Paulina Fusi, PharmD, BCPS 08/01/2016 1:56 PM

## 2016-08-01 NOTE — Care Management (Signed)
Patient with stage 4 lung cancer and feelings of hopelessness over condition and treatment intentionally overdosed on his pain meds.  His oncologist is at [facility] and a transfer was initiated in the ED but [facility] on diversion. Appears facility is still on diversion.  Attending will place order for palliative care.  Patient is having issues with swallowing and slp consult is pending.  He is under IVC and being followed by psychiatry.  If patient were to transfer to [facility], most likely would have to involve sheriff if remains under involuntary commitment

## 2016-08-01 NOTE — Progress Notes (Signed)
Palliative Medicine Consult Order Noted. Palliative Medicine Provider will return to [facility] on 08/04/2016, and patient will be evaluated then. Please call the Palliative Medicine Team office at [phone] if recommendations are needed in the interim.  Thank you for inviting [place] to see this patient.  Marjie Skiff Champagne Paletta, RN, BSN, [facility] 08/01/2016 12:07 PM Cell [phone] 8:00-4:00 Monday-Friday Office [phone]

## 2016-08-01 NOTE — Consult Note (Signed)
[facility] Consult   Reason for Consult:  Consult for this 65 year old man who came in to the hospital after an intentional suicide attempt by overdose of narcotics Referring Physician:  Tressia Miners Patient Identification: Douglas Shields MRN:  [id number] Principal Diagnosis: Adjustment disorder with mixed disturbance of emotions and conduct Diagnosis:   Patient Active Problem List   Diagnosis Date Noted  . Overdose [T50.901A] 07/06/2016  . Adjustment disorder with mixed disturbance of emotions and conduct [F43.25] 07/18/2016  . Suicide attempt [T14.91XA] 07/23/2016  . Chronic pain syndrome [G89.4] 07/24/2016    Total Time spent with patient: 20 minutes  Subjective:   Douglas Shields is a 65 y.o. male patient admitted with "I mostly feel stupid".  Follow-up for this 65 year old man in the hospital after an overdose of narcotics. Patient interviewed. Spoke with his wife as well. Spoke with the hospitalist currently on duty as well as nursing and reviewed the chart. Spoke with speech therapy. On interview today the patient says that he no longer has any wish to die. He regrets having taken the overdose. He repeats his statement that he feels "stupid". Patient has been cooperative with care in the hospital. He acknowledges that he primarily needs help with his pain and discomfort and medical illness. Patient's medical condition seems to be, if anything, worse than it was previously. He is having a lot of trouble breathing without congestion. He does say that he slept better last night. Appears to of tolerated medication without difficulty. Wife states that she does not believe he is at high risk to try to hurt himself and does not feel that he needs suicide watch precautions.  HPI:  Patient interviewed. Chart reviewed. Labs and vitals reviewed. Case reviewed with hospitalist. Spoke with his family briefly as well. 65 year old man presented to the emergency room today after  intentionally overdosing on his narcotic pain medicine. This morning when his wife woke up she noticed that he seemed to be mentally different than usual. She discovered that his entire bottle of morphine was gone and he admitted having overdosed on it. Patient tells me that between 11 and 12 last night he took his entire bottle of morphine sulfate probably about 70-75 tablets of 15 mg strength. He says he did this with the intention that he was going to die. He had been thinking about it for a couple of days. He is tired of the misery of his sickness and felt like his family would be better off if he were to die. Patient has been feeling down and negative ever since being treated for lung cancer. He describes his pain as being chronic and his sickness as being miserable. Can't sleep at night. Coughing constantly during the day. Worries about the suffering his family is going through. Patient denies any psychotic symptoms. Appetite has been decreased. No homicidal ideation. He is prescribed Zoloft 100 mg a day which is a chronic medicine started for anxiety symptoms years ago. Patient is suffering with lung cancer and has had both surgery and radiation treatment and is very symptomatic. He is uncertain as to what the status of his cancer is.  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 He is married. Has adult children as well as grandchildren and describes his relationship with all of them is good.  Medical history: Lung cancer. Unclear what the status is. He has said that he does not want to  get any further evaluation at this point because he feels too sick. Also has a history of trigeminal neuralgia.  Substance abuse history: Says that he drank a bit in his early 35s but since then has never had an alcohol problem no history of any other substance abuse at all.  Past Psychiatric History: Patient was treated for  anxiety symptoms several years ago. The way he describes it it sounds like he was having panic attacks that were waking him up at night. He was treated with Zoloft and has been pretty much under control now for a few years. No history of hospitalization. No prior suicide attempts.  Risk to Self: Is patient at risk for suicide?: Yes Risk to Others:   Prior Inpatient Therapy:   Prior Outpatient Therapy:    Past Medical History:  Past Medical History:  Diagnosis Date  . Lung cancer ([place])   . Trigeminal neuralgia     Past Surgical History:  Procedure Laterality Date  . biopsy for lung cancer     Family History:  Family History  Problem Relation Age of Onset  . Diabetes Mother   . Diabetes Father    Family Psychiatric  History: Does not know of any family history of mental illness Social History:  History  Alcohol Use No     History  Drug use: Unknown    Social History   Social History  . Marital status: Married    Spouse name: N/A  . Number of children: N/A  . Years of education: N/A   Social History Main Topics  . Smoking status: Former Smoker    Years: 60.00  . Smokeless tobacco: Never Used     Comment: quit 2 months ago  . Alcohol use No  . Drug use: Unknown  . Sexual activity: Not Asked   Other Topics Concern  . None   Social History Narrative   Lives at home with family.   Additional Social History:    Allergies:  No Known Allergies  Labs:  Results for orders placed or performed during the hospital encounter of 07/25/2016 (from the past 48 hour(s))  CBC     Status: Abnormal   Collection Time: 07/06/2016 10:27 AM  Result Value Ref Range   WBC 15.7 (H) 3.8 - 10.6 K/uL   RBC 5.55 4.40 - 5.90 MIL/uL   Hemoglobin 15.3 13.0 - 18.0 g/dL   HCT 46.5 40.0 - 52.0 %   MCV 83.7 80.0 - 100.0 fL   MCH 27.6 26.0 - 34.0 pg   MCHC 33.0 32.0 - 36.0 g/dL   RDW 14.7 (H) 11.5 - 14.5 %   Platelets 308 150 - 440 K/uL  Troponin I     Status: None   Collection Time:  08/03/2016 10:27 AM  Result Value Ref Range   Troponin I <0.03 <0.03 ng/mL  Comprehensive metabolic panel     Status: Abnormal   Collection Time: 07/16/2016 10:27 AM  Result Value Ref Range   Sodium 135 135 - 145 mmol/L   Potassium 3.7 3.5 - 5.1 mmol/L   Chloride 94 (L) 101 - 111 mmol/L   CO2 25 22 - 32 mmol/L   Glucose, Bld 222 (H) 65 - 99 mg/dL   BUN 20 6 - 20 mg/dL   Creatinine, Ser 1.00 0.61 - 1.24 mg/dL   Calcium 9.6 8.9 - 10.3 mg/dL   Total Protein 7.6 6.5 - 8.1 g/dL   Albumin 3.4 (L) 3.5 - 5.0 g/dL   AST 53 (  H) 15 - 41 U/L   ALT 120 (H) 17 - 63 U/L   Alkaline Phosphatase 114 38 - 126 U/L   Total Bilirubin 1.1 0.3 - 1.2 mg/dL   GFR calc non Af Amer >60 >60 mL/min   GFR calc Af Amer >60 >60 mL/min    Comment: (NOTE) The eGFR has been calculated using the CKD EPI equation. This calculation has not been validated in all clinical situations. eGFR's persistently <60 mL/min signify possible Chronic Kidney Disease.    Anion gap 16 (H) 5 - 15  Acetaminophen level     Status: Abnormal   Collection Time: 07/08/2016 10:27 AM  Result Value Ref Range   Acetaminophen (Tylenol), Serum <10 (L) 10 - 30 ug/mL    Comment:        THERAPEUTIC CONCENTRATIONS VARY SIGNIFICANTLY. A RANGE OF 10-30 ug/mL MAY BE AN EFFECTIVE CONCENTRATION FOR MANY PATIENTS. HOWEVER, SOME ARE BEST TREATED AT CONCENTRATIONS OUTSIDE THIS RANGE. ACETAMINOPHEN CONCENTRATIONS >150 ug/mL AT 4 HOURS AFTER INGESTION AND >50 ug/mL AT 12 HOURS AFTER INGESTION ARE OFTEN ASSOCIATED WITH TOXIC REACTIONS.   Salicylate level     Status: None   Collection Time: 08/04/2016 10:27 AM  Result Value Ref Range   Salicylate Lvl <0.3 2.8 - 30.0 mg/dL  Ethanol     Status: Abnormal   Collection Time: 07/13/2016 10:27 AM  Result Value Ref Range   Alcohol, Ethyl (B) 7 (H) <5 mg/dL    Comment:        LOWEST DETECTABLE LIMIT FOR SERUM ALCOHOL IS 5 mg/dL FOR MEDICAL PURPOSES ONLY   APTT     Status: Abnormal   Collection Time:  08/01/2016 10:27 AM  Result Value Ref Range   aPTT 38 (H) 24 - 36 seconds    Comment:        IF BASELINE aPTT IS ELEVATED, SUGGEST PATIENT RISK ASSESSMENT BE USED TO DETERMINE APPROPRIATE ANTICOAGULANT THERAPY.   Protime-INR     Status: Abnormal   Collection Time: 07/24/2016 10:27 AM  Result Value Ref Range   Prothrombin Time 23.8 (H) 11.4 - 15.2 seconds   INR 2.09   Lactic acid, plasma     Status: Abnormal   Collection Time: 07/22/2016 11:35 AM  Result Value Ref Range   Lactic Acid, Venous 2.0 (HH) 0.5 - 1.9 mmol/L    Comment: CRITICAL RESULT CALLED TO, READ BACK BY AND VERIFIED WITH KENDALL MOFFITT AT 1344 07/30/2016 DAS   Blood gas, venous     Status: Abnormal   Collection Time: 07/16/2016 11:35 AM  Result Value Ref Range   FIO2 1.00    Delivery systems NON-REBREATHER OXYGEN MASK    pH, Ven 7.29 7.250 - 7.430   pCO2, Ven 52 44.0 - 60.0 mmHg   pO2, Ven 64.0 (H) 32.0 - 45.0 mmHg   Bicarbonate 25.0 20.0 - 28.0 mmol/L   Acid-base deficit 2.4 (H) 0.0 - 2.0 mmol/L   O2 Saturation 89.3 %   Patient temperature 37.0    Collection site VENOUS    Sample type VENOUS   Lactic acid, plasma     Status: Abnormal   Collection Time: 07/09/2016  4:55 PM  Result Value Ref Range   Lactic Acid, Venous 2.3 (HH) 0.5 - 1.9 mmol/L    Comment: CRITICAL RESULT CALLED TO, READ BACK BY AND VERIFIED WITH [facility] AT 1810 08/04/2016 BY TFK.   TSH     Status: None   Collection Time: 08/01/2016  4:55 PM  Result Value Ref  Range   TSH 1.205 0.350 - 4.500 uIU/mL    Comment: Performed by a 3rd Generation assay with a functional sensitivity of <=0.01 uIU/mL.  Basic metabolic panel     Status: Abnormal   Collection Time: 08/01/16  3:51 AM  Result Value Ref Range   Sodium 137 135 - 145 mmol/L   Potassium 4.1 3.5 - 5.1 mmol/L   Chloride 98 (L) 101 - 111 mmol/L   CO2 30 22 - 32 mmol/L   Glucose, Bld 143 (H) 65 - 99 mg/dL   BUN 21 (H) 6 - 20 mg/dL   Creatinine, Ser 0.74 0.61 - 1.24 mg/dL   Calcium 8.8  (L) 8.9 - 10.3 mg/dL   GFR calc non Af Amer >60 >60 mL/min   GFR calc Af Amer >60 >60 mL/min    Comment: (NOTE) The eGFR has been calculated using the CKD EPI equation. This calculation has not been validated in all clinical situations. eGFR's persistently <60 mL/min signify possible Chronic Kidney Disease.    Anion gap 9 5 - 15  CBC     Status: Abnormal   Collection Time: 08/01/16  3:51 AM  Result Value Ref Range   WBC 8.6 3.8 - 10.6 K/uL   RBC 4.43 4.40 - 5.90 MIL/uL   Hemoglobin 12.6 (L) 13.0 - 18.0 g/dL   HCT 36.9 (L) 40.0 - 52.0 %   MCV 83.5 80.0 - 100.0 fL   MCH 28.5 26.0 - 34.0 pg   MCHC 34.2 32.0 - 36.0 g/dL   RDW 14.5 11.5 - 14.5 %   Platelets 170 150 - 440 K/uL    Current Facility-Administered Medications  Medication Dose Route Frequency Provider Last Rate Last Dose  . 0.9 %  sodium chloride infusion   Intravenous Continuous Gladstone Lighter, MD 75 mL/hr at 08/01/16 0620    . acetaminophen (TYLENOL) tablet 650 mg  650 mg Oral Q6H PRN Gladstone Lighter, MD       Or  . acetaminophen (TYLENOL) suppository 650 mg  650 mg Rectal Q6H PRN Gladstone Lighter, MD      . apixaban (ELIQUIS) tablet 10 mg  10 mg Oral BID Vira Blanco, RPH       Followed by  . [START ON 08/08/2016] apixaban (ELIQUIS) tablet 5 mg  5 mg Oral BID Vira Blanco, RPH      . carbamazepine (TEGRETOL) tablet 200 mg  200 mg Oral TID Gladstone Lighter, MD   200 mg at 08/01/16 0917  . diltiazem (CARDIZEM) injection 5 mg  5 mg Intravenous Q6H PRN Gladstone Lighter, MD      . docusate sodium (COLACE) capsule 100 mg  100 mg Oral BID PRN Henreitta Leber, MD      . feeding supplement (BOOST / RESOURCE BREEZE) liquid 1 Container  1 Container Oral TID BM Henreitta Leber, MD      . metoprolol tartrate (LOPRESSOR) tablet 25 mg  25 mg Oral BID Gladstone Lighter, MD   25 mg at 08/01/16 0916  . mirtazapine (REMERON) tablet 15 mg  15 mg Oral QHS Gonzella Lex, MD   15 mg at 07/08/2016 2204  . ondansetron (ZOFRAN) tablet 4  mg  4 mg Oral Q6H PRN Gladstone Lighter, MD       Or  . ondansetron (ZOFRAN) injection 4 mg  4 mg Intravenous Q6H PRN Gladstone Lighter, MD   4 mg at 07/21/2016 1711  . pantoprazole (PROTONIX) EC tablet 40 mg  40 mg Oral Daily  Gladstone Lighter, MD   40 mg at 08/01/16 0916  . senna (SENOKOT) tablet 8.6 mg  1 tablet Oral BID PRN Henreitta Leber, MD      . sertraline (ZOLOFT) tablet 150 mg  150 mg Oral Daily Gonzella Lex, MD   150 mg at 08/01/16 0916  . tamsulosin (FLOMAX) capsule 0.8 mg  0.8 mg Oral QHS Gladstone Lighter, MD   0.8 mg at 07/06/2016 2204    Musculoskeletal: Strength & Muscle Tone: decreased Gait & Station: unable to stand Patient leans: N/A  Psychiatric Specialty Exam: Physical Exam  Nursing note and vitals reviewed. Constitutional: He appears well-developed and well-nourished. He appears distressed.  HENT:  Head: Normocephalic and atraumatic.  Eyes: Conjunctivae are normal. Pupils are equal, round, and reactive to light.  Neck: Normal range of motion.  Cardiovascular: Regular rhythm and normal heart sounds.   Respiratory: He is in respiratory distress.  GI: Soft.  Musculoskeletal:       Right shoulder: He exhibits decreased strength.  Neurological: He is alert.  Skin: Skin is warm and dry.  Psychiatric: His affect is blunt. His speech is delayed. He is slowed. Cognition and memory are normal. He expresses impulsivity. He expresses suicidal ideation.    Review of Systems  Constitutional: Positive for malaise/fatigue and weight loss.  HENT: Negative.   Eyes: Negative.   Respiratory: Positive for cough, sputum production and shortness of breath.   Cardiovascular: Positive for chest pain.  Gastrointestinal: Positive for abdominal pain.  Musculoskeletal: Negative.   Skin: Negative.   Neurological: Positive for weakness.  Psychiatric/Behavioral: Positive for depression. Negative for hallucinations, memory loss, substance abuse and suicidal ideas. The patient is not  nervous/anxious and does not have insomnia.     Blood pressure (!) 117/57, pulse (!) 114, temperature 98 F (36.7 C), resp. rate 20, height 6' (1.829 m), weight 95.9 kg (211 lb 6.4 oz), SpO2 95 %.Body mass index is 28.67 kg/m.  General Appearance: Disheveled  Eye Contact:  Fair  Speech:  Slow  Volume:  Decreased  Mood:  Dysphoric  Affect:  Blunt  Thought Process:  Goal Directed  Orientation:  Full (Time, Place, and Person)  Thought Content:  Logical  Suicidal Thoughts:  No  Homicidal Thoughts:  No  Memory:  Immediate;   Good Recent;   Good Remote;   Fair  Judgement:  Impaired  Insight:  Shallow  Psychomotor Activity:  Decreased  Concentration:  Concentration: Fair  Recall:  [employer] of Knowledge:  Fair  Language:  Fair  Akathisia:  No  Handed:  Right  AIMS (if indicated):     Assets:  Communication Skills Housing Resilience Social Support  ADL's:  Impaired  Cognition:  WNL  Sleep:        Treatment Plan Summary: Daily contact with patient to assess and evaluate symptoms and progress in treatment, Medication management and Plan After speaking with the patient and his wife as well as the current care team I am discontinuing the involuntary commitment and suicide precautions. I do not think it would be in this patient's best interest to consider inpatient psychiatric level care. He is completely cooperative with recommended treatment at this time and therefore does not require involuntary commitment. I have made some adjustments to his antidepressant medicines and will continue those without any further change today. Concern about his mental health should obviously be part of a ongoing palliative care evaluation. I understand that he has been referred to [facility] where he  gets his full cancer care. He is on the waiting list. If the patient is still here after the weekend I will continue to follow-up. If additional psychiatric help is needed over the weekend please contact  the psychiatrist on call.  Disposition: Supportive therapy provided about ongoing stressors.  Alethia Berthold, MD 08/01/2016 2:16 PM

## 2016-08-01 NOTE — Plan of Care (Signed)
Problem: SLP Dysphagia Goals Goal: Misc Dysphagia Goal Pt will safely tolerate po diet of least restrictive consistency w/ no overt s/s of aspiration noted by Staff/pt/family x3 sessions.    

## 2016-08-01 NOTE — Progress Notes (Signed)
Patient hasnt voided all night, bladder scanned showed 501. Pt was able to void some, bladder scan was performed after urination, it showed 513 in bladder. MD Paged. Awaiting call back. Will continue to monitor.

## 2016-08-01 NOTE — Progress Notes (Signed)
Patient was noted to be in distress, using his accessory muscles, HR elevated in the 140"s oxygen level was 90% on 4L oxygen bump up to 6l, patient oxygen saturation 93% on 6l. Attending made aware, new order to give '60mg'$  solumedrol q6hrs, duoneb tx q6hrs and Pulmicort 0.'5MG'$  neb tx BID. Admistered as ordered and Cardizem prn  iv push given as ordered. Patient continues to desat, rapid response  was called, DR Tressia Miners in attendant, patient was placed on Bipap saturation 97%,new order to give another iv push of '10mg'$  Cardizem, lasix '40mg'$  iv push and transported to ICU. Bedside report given to the receiving nurse.

## 2016-08-01 NOTE — Progress Notes (Signed)
[place] [facility] [place] NAME: Douglas Shields    MR#:  [id number]  DATE OF BIRTH:  October 29, 1950  SUBJECTIVE:   Patient is here due to drug overdose secondary to MS Contin. Mental status much improved today. Incidentally also noted to have acute pulmonary emboli with DVT. Patient recently diagnosed with lung cancer and currently getting treatment with chemoradiation at [facility]. I have called [facility] in place him on a waiting list for transfer as per the family's request.  REVIEW OF SYSTEMS:    Review of Systems  Constitutional: Negative for chills and fever.  HENT: Negative for congestion and tinnitus.   Eyes: Negative for blurred vision and double vision.  Respiratory: Positive for cough and shortness of breath. Negative for wheezing.   Cardiovascular: Negative for chest pain, orthopnea and PND.  Gastrointestinal: Negative for abdominal pain, diarrhea, nausea and vomiting.  Genitourinary: Negative for dysuria and hematuria.  Neurological: Positive for weakness. Negative for dizziness, sensory change and focal weakness.  Psychiatric/Behavioral: Positive for depression.  All other systems reviewed and are negative.   Nutrition: Heart healthy Tolerating Diet: Yes Tolerating PT: Await Eval.     DRUG ALLERGIES:  No Known Allergies  VITALS:  Blood pressure (!) 117/57, pulse (!) 114, temperature 98 F (36.7 C), resp. rate 20, height 6' (1.829 m), weight 95.9 kg (211 lb 6.4 oz), SpO2 95 %.  PHYSICAL EXAMINATION:   Physical Exam  GENERAL:  65 y.o.-year-old patient lying in bed lethargic in mild resp. Distress.  EYES: Pupils equal, round, reactive to light and accommodation. No scleral icterus. Extraocular muscles intact.  HEENT: Head atraumatic, normocephalic. Oropharynx and nasopharynx clear.  Dry Oral Mucosa.  NECK:  Supple, no jugular venous distention. No thyroid enlargement, no tenderness.  LUNGS: Poor resp. effort, no wheezing, b/l rales, No  rhonchi. No use of accessory muscles of respiration.  CARDIOVASCULAR: S1, S2 normal. No murmurs, rubs, or gallops.  ABDOMEN: Soft, nontender, nondistended. Bowel sounds present. No organomegaly or mass.  EXTREMITIES: No cyanosis, clubbing or edema b/l.    NEUROLOGIC: Cranial nerves II through XII are intact. No focal Motor or sensory deficits b/l. Globally weak.   PSYCHIATRIC: The patient is alert and oriented x 3.  SKIN: No obvious rash, lesion, or ulcer.    LABORATORY PANEL:   CBC  Recent Labs Lab 08/01/16 0351  WBC 8.6  HGB 12.6*  HCT 36.9*  PLT 170   ------------------------------------------------------------------------------------------------------------------  Chemistries   Recent Labs Lab 07/07/2016 1027 08/01/16 0351  NA 135 137  K 3.7 4.1  CL 94* 98*  CO2 25 30  GLUCOSE 222* 143*  BUN 20 21*  CREATININE 1.00 0.74  CALCIUM 9.6 8.8*  AST 53*  --   ALT 120*  --   ALKPHOS 114  --   BILITOT 1.1  --    ------------------------------------------------------------------------------------------------------------------  Cardiac Enzymes  Recent Labs Lab 08/03/2016 1027  TROPONINI <0.03   ------------------------------------------------------------------------------------------------------------------  RADIOLOGY:  Ct Angio Chest Pe W Or Wo Contrast  Result Date: 07/14/2016 CLINICAL DATA:  Left popliteal acute nonocclusive DVT on recent Doppler. Dyspnea EXAM: CT ANGIOGRAPHY CHEST WITH CONTRAST TECHNIQUE: Multidetector CT imaging of the chest was performed using the standard protocol during bolus administration of intravenous contrast. Multiplanar CT image reconstructions and MIPs were obtained to evaluate the vascular anatomy. CONTRAST:  125 mL Isovue 370 intravenous COMPARISON:  Chest x-ray 07/28/2016 FINDINGS: Cardiovascular: Nonocclusive filling defect is present within right middle lobe segmental and subsegmental pulmonary artery, consistent  with acute embolus,  series 8, image number 141 through 154. Estimated RV LV ratio is 1.1. This would be compatible with right heart strain. There are no other discrete filling defects to suggest additional emboli. There are coronary artery calcifications. There is atherosclerosis of the aorta. Heart size is borderline enlarged. No large pericardial effusion. No dissection. Mediastinum/Nodes: Dilated fluid-filled esophagus. Trachea and mainstem bronchi within normal limits. No axillary adenopathy. There is bilateral hilar adenopathy, measuring up to 2.5 cm on the right. There is mediastinal adenopathy a right pretracheal lymph node measures 1.4 cm. Ill-defined irregular left hilar and infrahilar soft tissue, with narrowing of left lower lobe pulmonary artery. On the coronal images, this measures approximately 3.9 by 2.8 cm. Soft tissue density/ mass extends into the mediastinum, anterior to the aorta and posterior to the upper aspect of left atrium. Lungs/Pleura: There are innumerable pulmonary nodules and masses throughout all lobes of the lung. Small left-sided pleural effusion. Airspace disease is present within the bilateral lower lobes and the right middle lobe and may relate to a pneumonia or combination of pneumonia and mass(es). Upper Abdomen: Imaged liver, spleen, and gallbladder are unremarkable. There is mild nodular thickening of the adrenal glands. Musculoskeletal: Bilateral gynecomastia. No lytic or blastic bone lesions. Review of the MIP images confirms the above findings. IMPRESSION: 1. Nonocclusive acute pulmonary embolus within segmental and subsegmental right middle lobe pulmonary arteries. Positive for acute PE with CTevidence of right heart strain (RV/LV Ratio = 1.1) consistent with at least submassive (intermediate risk) PE. The presence of right heart strain has been associated with an increased risk of morbidity and mortality. 2. Innumerable lung nodules and masses, consistent with metastatic disease. Mediastinal  and hilar adenopathy. Ill-defined soft tissue mass in the left hilar and infrahilar region with extension into the mediastinum could represent metastatic disease or primary lung neoplasm. 3. Ill-defined airspace disease within the bilateral lower lobes and right middle lobes may reflect combination of pneumonia and nodule/metastatic disease. 4. Fluid-filled mildly dilated esophagus, cannot rule out distal esophageal stricture. Barium swallow study may be obtained when clinically feasible. Critical Value/emergent results were called by telephone at the time of interpretation on 07/12/2016 at 9:33 pm to Dr. Dr. Ara Kussmaul who was covering for Dr. Gladstone Lighter , who verbally acknowledged these results. Electronically Signed   By: Donavan Foil M.D.   On: 07/20/2016 21:34   US Venous Img Lower Bilateral  Result Date: 07/25/2016 CLINICAL DATA:  Bilateral lower extremity acute swelling for 1 day. EXAM: BILATERAL LOWER EXTREMITY VENOUS DOPPLER ULTRASOUND TECHNIQUE: Gray-scale sonography with graded compression, as well as color Doppler and duplex ultrasound were performed to evaluate the lower extremity deep venous systems from the level of the common femoral vein and including the common femoral, femoral, profunda femoral, popliteal and calf veins including the posterior tibial, peroneal and gastrocnemius veins when visible. The superficial great saphenous vein was also interrogated. Spectral Doppler was utilized to evaluate flow at rest and with distal augmentation maneuvers in the common femoral, femoral and popliteal veins. COMPARISON:  None. FINDINGS: RIGHT LOWER EXTREMITY Common Femoral Vein: No evidence of thrombus. Normal compressibility, respiratory phasicity and response to augmentation. Saphenofemoral Junction: No evidence of thrombus. Normal compressibility and flow on color Doppler imaging. Profunda Femoral Vein: No evidence of thrombus. Normal compressibility and flow on color Doppler imaging.  Femoral Vein: No evidence of thrombus. Normal compressibility, respiratory phasicity and response to augmentation. Popliteal Vein: Positive for hypoechoic intraluminal nonocclusive thrombus. Vessel is noncompressible. Phasic flow remains.  Augmentation not performed. Calf Veins: No evidence of thrombus. Normal compressibility and flow on color Doppler imaging. Superficial Great Saphenous Vein: No evidence of thrombus. Normal compressibility and flow on color Doppler imaging. Venous Reflux:  None. Other Findings:  None. LEFT LOWER EXTREMITY Common Femoral Vein: No evidence of thrombus. Normal compressibility, respiratory phasicity and response to augmentation. Saphenofemoral Junction: No evidence of thrombus. Normal compressibility and flow on color Doppler imaging. Profunda Femoral Vein: No evidence of thrombus. Normal compressibility and flow on color Doppler imaging. Femoral Vein: No evidence of thrombus. Normal compressibility, respiratory phasicity and response to augmentation. Popliteal Vein: No evidence of thrombus. Normal compressibility, respiratory phasicity and response to augmentation. Calf Veins: No evidence of thrombus. Normal compressibility and flow on color Doppler imaging. Superficial Great Saphenous Vein: No evidence of thrombus. Normal compressibility and flow on color Doppler imaging. Venous Reflux:  None. Other Findings:  None. IMPRESSION: Positive for right popliteal nonocclusive acute appearing DVT over a short segment. Negative for left lower extremity DVT. These results will be called to the ordering clinician or representative by the Radiologist Assistant, and communication documented in the PACS or zVision Dashboard. Electronically Signed   By: Jerilynn Mages.  Shick M.D.   On: 08/05/2016 16:56   Dg Chest Port 1 View  Result Date: 07/25/2016 CLINICAL DATA:  Atrial fibrillation.  Elevated blood pressure. EXAM: PORTABLE CHEST 1 VIEW COMPARISON:  None. FINDINGS: Innumerable bilateral pulmonary nodules  are identified. One of the largest is in the right upper lobe measuring 2.4 cm. There is likely mediastinal lymphadenopathy. Small left effusion is noted. No pneumothorax or consolidative process. Heart size is normal. No focal bony abnormality. IMPRESSION: Innumerable bilateral pulmonary nodules consistent with metastatic disease. Small left pleural effusion. Electronically Signed   By: Inge Rise M.D.   On: 07/20/2016 11:15     ASSESSMENT AND PLAN:   65 year old male with past medical history of lung cancer currently getting chemotherapy/radiation, history of trigeminal neuralgia, BPH, depression who presented to the hospital after a drug overdose as patient took increasing amounts of MS Contin.  1. Drug overdose/major depression-patient took increasing amounts of MS Contin. -Mental status much improved and back to baseline now. Seen by psychiatry and involuntary commitment has been discontinued. DC sitter. -Continue increasing doses of Zoloft and Remeron and further care as per psychiatry.  2. Pulmonary embolism/DVT-this was noted on a CT chest on admission. Patient was also noted to have a nonocclusive DVT on Dopplers of lower extremities. -Was on Lovenox and will transition to Eliquis today.  3. Atrial fibrillation with rapid ventricular response-secondary to underlying chronic respiratory illness. -Converted back to normal sinus rhythm. Continue low-dose oral metoprolol. -Echocardiogram showing normal ejection fraction.  4. Trigeminal Neuralgia - cont. Tegretol.   5. Hx of Lung cancer - currently getting chemo/radiation treatment at [facility].  - prognosis is poor as pt. Has been declining due to failure to thrive.  Palliative care consult to discuss goals of care.   6. Head Lice - s/p Permethrin cream treatment.   7. GERD - cont. Protonix.   8. BPH - cont. Flomax.   Pt's extensively followed by  [facility] and family would like for him to be transferred there.  [facility] is currently on  Diversion.  I have placed him on a transfer list to [facility]. Accepting 46 name is Dr. Heloise Purpura.       All the records are reviewed and case discussed with Care Management/Social Worker. Management plans discussed with the patient, family and they are  in agreement.  CODE STATUS: Full  DVT Prophylaxis: Eliquis  TOTAL TIME TAKING CARE OF THIS PATIENT: 40 minutes.   POSSIBLE D/C IN 2-3 DAYS, DEPENDING ON CLINICAL CONDITION.   Henreitta Leber M.D on 08/01/2016 at 2:41 PM  Between 7am to 6pm - Pager - [phone]  After 6pm go to [email or web address] - Proofreader  [facility]  Office  [phone]  CC: Primary care physician; Jacklynn Barnacle, MD

## 2016-08-01 NOTE — Progress Notes (Addendum)
[facility] [place] NAME: Douglas Shields    MR#:  [id number]  DATE OF BIRTH:  [birth year]  SUBJECTIVE:  CHIEF COMPLAINT:   Chief Complaint  Patient presents with  . Drug Overdose  . Suicide Attempt   Came to evaluate the patient due to rapid worsening in his respiratory status - Patient is more lethargic, has increased work of breathing. Still alert and following commands ABG with hypercarbia and hypoxia  REVIEW OF SYSTEMS:  Review of Systems  Unable to perform ROS: Severe respiratory distress    DRUG ALLERGIES:  No Known Allergies  VITALS:  Blood pressure 127/72, pulse (!) 130, temperature 98 F (36.7 C), resp. rate 20, height 6' (1.829 m), weight 95.9 kg (211 lb 6.4 oz), SpO2 93 %.  PHYSICAL EXAMINATION:  Physical Exam  GENERAL:  65 y.o.-year-old patient sitting in the bed, tachypneic, increased work of breathing. Critically ill appearing  EYES: Pupils equal, round, reactive to light and accommodation. No scleral icterus. Extraocular muscles intact.  HEENT: Head atraumatic, normocephalic. Oropharynx and nasopharynx clear. Placed on Bipap mask. NECK:  Supple, no jugular venous distention. No thyroid enlargement, no tenderness.  LUNGS: coarse breath sounds bilaterally, no rales,rhonchi or crepitation. Using accessory muscles of respiration.  Decreased bibasilar breath sounds CARDIOVASCULAR: S1, S2 normal. No murmurs, rubs, or gallops.  ABDOMEN: Soft, nontender, nondistended. Bowel sounds present. No organomegaly or mass.  EXTREMITIES: No pedal edema, cyanosis, or clubbing. 2+ bilateral lower extremity edema NEUROLOGIC: following simple commands. Unable to do complete neuro exam PSYCHIATRIC: The patient is alert   SKIN: No obvious rash, lesion, or ulcer.    LABORATORY PANEL:   CBC  Recent Labs Lab 08/01/16 0351  WBC 8.6  HGB 12.6*  HCT 36.9*  PLT 170    ------------------------------------------------------------------------------------------------------------------  Chemistries   Recent Labs Lab 07/08/2016 1027 08/01/16 0351  NA 135 137  K 3.7 4.1  CL 94* 98*  CO2 25 30  GLUCOSE 222* 143*  BUN 20 21*  CREATININE 1.00 0.74  CALCIUM 9.6 8.8*  AST 53*  --   ALT 120*  --   ALKPHOS 114  --   BILITOT 1.1  --    ------------------------------------------------------------------------------------------------------------------  Cardiac Enzymes  Recent Labs Lab 07/14/2016 1027  TROPONINI <0.03   ------------------------------------------------------------------------------------------------------------------  RADIOLOGY:  Ct Angio Chest Pe W Or Wo Contrast  Result Date: 07/25/2016 CLINICAL DATA:  Left popliteal acute nonocclusive DVT on recent Doppler. Dyspnea EXAM: CT ANGIOGRAPHY CHEST WITH CONTRAST TECHNIQUE: Multidetector CT imaging of the chest was performed using the standard protocol during bolus administration of intravenous contrast. Multiplanar CT image reconstructions and MIPs were obtained to evaluate the vascular anatomy. CONTRAST:  125 mL Isovue 370 intravenous COMPARISON:  Chest x-ray 07/23/2016 FINDINGS: Cardiovascular: Nonocclusive filling defect is present within right middle lobe segmental and subsegmental pulmonary artery, consistent with acute embolus, series 8, image number 141 through 154. Estimated RV LV ratio is 1.1. This would be compatible with right heart strain. There are no other discrete filling defects to suggest additional emboli. There are coronary artery calcifications. There is atherosclerosis of the aorta. Heart size is borderline enlarged. No large pericardial effusion. No dissection. Mediastinum/Nodes: Dilated fluid-filled esophagus. Trachea and mainstem bronchi within normal limits. No axillary adenopathy. There is bilateral hilar adenopathy, measuring up to 2.5 cm on the right. There is mediastinal  adenopathy a right pretracheal lymph node measures 1.4 cm. Ill-defined irregular left hilar and infrahilar soft tissue, with narrowing of left  lower lobe pulmonary artery. On the coronal images, this measures approximately 3.9 by 2.8 cm. Soft tissue density/ mass extends into the mediastinum, anterior to the aorta and posterior to the upper aspect of left atrium. Lungs/Pleura: There are innumerable pulmonary nodules and masses throughout all lobes of the lung. Small left-sided pleural effusion. Airspace disease is present within the bilateral lower lobes and the right middle lobe and may relate to a pneumonia or combination of pneumonia and mass(es). Upper Abdomen: Imaged liver, spleen, and gallbladder are unremarkable. There is mild nodular thickening of the adrenal glands. Musculoskeletal: Bilateral gynecomastia. No lytic or blastic bone lesions. Review of the MIP images confirms the above findings. IMPRESSION: 1. Nonocclusive acute pulmonary embolus within segmental and subsegmental right middle lobe pulmonary arteries. Positive for acute PE with CTevidence of right heart strain (RV/LV Ratio = 1.1) consistent with at least submassive (intermediate risk) PE. The presence of right heart strain has been associated with an increased risk of morbidity and mortality. 2. Innumerable lung nodules and masses, consistent with metastatic disease. Mediastinal and hilar adenopathy. Ill-defined soft tissue mass in the left hilar and infrahilar region with extension into the mediastinum could represent metastatic disease or primary lung neoplasm. 3. Ill-defined airspace disease within the bilateral lower lobes and right middle lobes may reflect combination of pneumonia and nodule/metastatic disease. 4. Fluid-filled mildly dilated esophagus, cannot rule out distal esophageal stricture. Barium swallow study may be obtained when clinically feasible. Critical Value/emergent results were called by telephone at the time of  interpretation on 08/01/2016 at 9:33 pm to Dr. Dr. Ara Kussmaul who was covering for Dr. Gladstone Lighter , who verbally acknowledged these results. Electronically Signed   By: Donavan Foil M.D.   On: 07/29/2016 21:34   US Venous Img Lower Bilateral  Result Date: 07/20/2016 CLINICAL DATA:  Bilateral lower extremity acute swelling for 1 day. EXAM: BILATERAL LOWER EXTREMITY VENOUS DOPPLER ULTRASOUND TECHNIQUE: Gray-scale sonography with graded compression, as well as color Doppler and duplex ultrasound were performed to evaluate the lower extremity deep venous systems from the level of the common femoral vein and including the common femoral, femoral, profunda femoral, popliteal and calf veins including the posterior tibial, peroneal and gastrocnemius veins when visible. The superficial great saphenous vein was also interrogated. Spectral Doppler was utilized to evaluate flow at rest and with distal augmentation maneuvers in the common femoral, femoral and popliteal veins. COMPARISON:  None. FINDINGS: RIGHT LOWER EXTREMITY Common Femoral Vein: No evidence of thrombus. Normal compressibility, respiratory phasicity and response to augmentation. Saphenofemoral Junction: No evidence of thrombus. Normal compressibility and flow on color Doppler imaging. Profunda Femoral Vein: No evidence of thrombus. Normal compressibility and flow on color Doppler imaging. Femoral Vein: No evidence of thrombus. Normal compressibility, respiratory phasicity and response to augmentation. Popliteal Vein: Positive for hypoechoic intraluminal nonocclusive thrombus. Vessel is noncompressible. Phasic flow remains. Augmentation not performed. Calf Veins: No evidence of thrombus. Normal compressibility and flow on color Doppler imaging. Superficial Great Saphenous Vein: No evidence of thrombus. Normal compressibility and flow on color Doppler imaging. Venous Reflux:  None. Other Findings:  None. LEFT LOWER EXTREMITY Common Femoral Vein: No  evidence of thrombus. Normal compressibility, respiratory phasicity and response to augmentation. Saphenofemoral Junction: No evidence of thrombus. Normal compressibility and flow on color Doppler imaging. Profunda Femoral Vein: No evidence of thrombus. Normal compressibility and flow on color Doppler imaging. Femoral Vein: No evidence of thrombus. Normal compressibility, respiratory phasicity and response to augmentation. Popliteal Vein: No evidence of thrombus. Normal  compressibility, respiratory phasicity and response to augmentation. Calf Veins: No evidence of thrombus. Normal compressibility and flow on color Doppler imaging. Superficial Great Saphenous Vein: No evidence of thrombus. Normal compressibility and flow on color Doppler imaging. Venous Reflux:  None. Other Findings:  None. IMPRESSION: Positive for right popliteal nonocclusive acute appearing DVT over a short segment. Negative for left lower extremity DVT. These results will be called to the ordering clinician or representative by the Radiologist Assistant, and communication documented in the PACS or zVision Dashboard. Electronically Signed   By: Jerilynn Mages.  Shick M.D.   On: 08/04/2016 16:56   Dg Chest Port 1 View  Result Date: 07/10/2016 CLINICAL DATA:  Atrial fibrillation.  Elevated blood pressure. EXAM: PORTABLE CHEST 1 VIEW COMPARISON:  None. FINDINGS: Innumerable bilateral pulmonary nodules are identified. One of the largest is in the right upper lobe measuring 2.4 cm. There is likely mediastinal lymphadenopathy. Small left effusion is noted. No pneumothorax or consolidative process. Heart size is normal. No focal bony abnormality. IMPRESSION: Innumerable bilateral pulmonary nodules consistent with metastatic disease. Small left pleural effusion. Electronically Signed   By: Inge Rise M.D.   On: 07/09/2016 11:15    EKG:   Orders placed or performed during the hospital encounter of 08/05/2016  . ED EKG  . ED EKG  . EKG 12-Lead  . EKG  12-Lead  . EKG 12-Lead  . EKG 12-Lead  . EKG 12-Lead  . EKG 12-Lead    ASSESSMENT AND PLAN:   Jamee Pacholski  is a 65 y.o. male with a known history of Smoking history, trigeminal neuralgia, recent diagnosis of lung cancer just finished radiation presents to hospital after he overdosed on extended release morphine. Noted to be in A. fib with RVR on presentation. Admitted to telemetry floor- went into acute hypoxic respiratory failure this evening  #1 Acute hypoxic respiratory failure- has known lung cancer- finished radiation, also acute submassive PE - change xarelto to IV heparin drip - ABG with hypercarbia and hypoxia - continue Bipap- repeat ABG in 2 hours - Pulmonary consult - lasix IV once and also CXR stat - CT chest with possible pna vs lung cancer- add ABX for now - also nonocclusive left popliteal DVT on dopplers  #2 Afib with rvr- secondary to right heart strain from his PE - ECHO normal - cardizem drip once in ICU if rate not controlled  #3 Lung cancer- follows at [facility]< bilateral lung nodules S/p radiation, about to follow up with oncology for chemo and further trt plans Failure to thrive- ? Poor prognosis Palliative care consult  #4 Drug overdose- off involuntary commitment Appreciate psych consult On anti depressants  #5 Head lice- permethrin trt  #6 DVT prophylaxis- heparin IV   Discussed with patients wife as patient has verbally given permission to speak with her and also she will be making decisions with him. Explained the multiple medical problems, poor prognosis, critical nature of his condition now- she wants him to get aggressive treatment and also decided to keep him Full Code.      All the records are reviewed and case discussed with Care Management/Social Workerr. Management plans discussed with the patient, family and they are in agreement.  CODE STATUS: Full Code  TOTAL CRITICAL CARE TIME SPENT IN TAKING CARE OF THIS PATIENT: 37 minutes.       Sheresa Cullop M.D on 08/01/2016 at 4:59 PM  Between 7am to 6pm - Pager - [phone]  After 6pm go to [email or web address] - [place]  [place]  [phone]  CC: Primary care physician; Jacklynn Barnacle, MD

## 2016-08-01 NOTE — Progress Notes (Signed)
Transferred patient to ICU room 7 from 244 on Bipap 20/10 and 50%. Sats, once settled, were 97%. Decreased Fio2 to 40%.

## 2016-08-01 NOTE — Progress Notes (Signed)
*  PRELIMINARY RESULTS* Echocardiogram 2D Echocardiogram has been performed.  Douglas Shields 08/01/2016, 11:03 AM

## 2016-08-01 NOTE — Consult Note (Signed)
PULMONARY / CRITICAL CARE MEDICINE   Name: Douglas Shields MRN: [id number] DOB: 04/24/1951    ADMISSION DATE:  07/30/2016 CONSULTATION DATE:  08/01/2016  REFERRING MD:  Dr. 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  He presented to [facility] ER on 10/26 following an intentional overdose of extended release morphine.  According to ER notes he was recently diagnosed with squamous cell lung cancer 2 months ago and has been depressed since the diagnosis.  He is followed by his oncologist at [facility], a PET scan on 06/17/16 that showed primary lung malignancy with bilateral pulmonary, mediastinal, liver, mesenteric, subcutaneous, and intramuscular metastases, MRI of brain showed no evidence of intracranial metastasis.  In spite of finishing palliative radiation 3 weeks ago he has not felt better.  He has had a decreased appetite and decreased energy.  Prior to the diagnosis he was able to ambulate to the bathroom, however currently he uses the urinal.  Per ER notes he has felt hopeless and has had thought of harming himself.  On the night of 10/25 he acted on his thoughts and took a total of 78 pills of 15 mg MS Contin.  He felt drowsy and therefore went to sleep.  On the morning of 10/26 his wife attempted to wake him up, however he was difficult to arouse, therefore EMS was notified.  Upon EMS arrival they saw an empty pill bottle and by the time he was transported to the ER he was awake and alert, however he felt sick on the stomach. He was noted to be in new onset atrial fibrillation with a heart rate of 170, however he spontaneously converted to sinus tachycardia heart rate 110.  He was subsequently admitted to [facility].   On 10/26 an [place] of bilateral lower extremities was performed due to acute swelling, which was positive for right popliteal nonocclusive appearing DVT over a short segment and no DVT present in the left lower extremity.  Subsequently a CT of chest was performed on 10/26 to assess for a PE the results were positive for acute PE with CT evidence of right heart strain.  On 10/27 a rapid response was initiated due to worsening respiratory distress and the pt was placed on Bipap and transferred to ICU.  PCCM consulted on 10/27 for additional management of acute on chronic hypoxic respiratory failure secondary to squamous cell lung cancer and questionable flash pulmonary edema requiring Bipap.    PAST MEDICAL HISTORY :  He  has a past medical history of Lung cancer ([place]) and Trigeminal neuralgia.  PAST SURGICAL HISTORY: He  has a past surgical history that includes biopsy for lung cancer.  No Known Allergies  No current facility-administered medications on file prior to encounter.    No current outpatient prescriptions on file prior to encounter.    FAMILY HISTORY:  His indicated that the status of his mother is unknown. He indicated that the status of his father is unknown.    SOCIAL HISTORY: He  reports that he has quit smoking. He quit after 60.00 years of use. He has never used smokeless tobacco. He reports that he does not drink alcohol.  REVIEW OF SYSTEMS:   Unable to perform pt in  severe respiratory distress on bipap  SUBJECTIVE:  Pt currently able to follow commands, he states "I am ready to die" while talking to his wife and family about code status.  VITAL SIGNS: BP 106/72   Pulse (!) 126   Temp (!) 102.7 F (39.3 C) (Oral)   Resp (!) 30   Ht 6' (1.829 m)   Wt 211 lb 6.4 oz (95.9 kg)   SpO2 92%   BMI 28.67 kg/m   HEMODYNAMICS:    VENTILATOR SETTINGS:    INTAKE / OUTPUT: I/O last 3 completed shifts: In: 805 [I.V.:805] Out: 0   PHYSICAL EXAMINATION: General:   Acutely ill appearing Caucasian male Neuro:  Oriented, follows commands, PERRLA,  HEENT:  Supple, mild JVD Cardiovascular:  Sinus tachycardia, s1s2, rrr, 2+ bilateral pitting lower extremity edema Lungs: diminished right middle lobe, coarse all other lobes, labored, tachypneic on Bipap, use of accessory muscles to breath Abdomen: +BS x4, soft, non tender, non distended   Musculoskeletal:  Normal bulk and tone Skin:  Scabbed over abrasion right jaw line, no rashes or lesions   LABS:  BMET  Recent Labs Lab 07/16/2016 1027 08/01/16 0351 08/01/16 1736  NA 135 137 138  K 3.7 4.1 3.7  CL 94* 98* 99*  CO2 '25 30 24  '$ BUN 20 21* 21*  CREATININE 1.00 0.74 0.84  GLUCOSE 222* 143* 206*    Electrolytes  Recent Labs Lab 07/07/2016 1027 08/01/16 0351 08/01/16 1736  CALCIUM 9.6 8.8* 9.0    CBC  Recent Labs Lab 08/03/2016 1027 08/01/16 0351 08/01/16 1736  WBC 15.7* 8.6 9.1  HGB 15.3 12.6* 13.2  HCT 46.5 36.9* 38.0*  PLT 308 170 173    Coag's  Recent Labs Lab 07/12/2016 1027 08/01/16 1736  APTT 38* 61*  INR 2.09  --     Sepsis Markers  Recent Labs Lab 07/08/2016 1135 07/20/2016 1655 08/01/16 1736  LATICACIDVEN 2.0* 2.3* 1.9    ABG  Recent Labs Lab 08/01/16 1616 08/01/16 1750  PHART 7.37 7.40  PCO2ART 50* 42  PO2ART 46* 55*    Liver Enzymes  Recent Labs Lab 07/16/2016 1027  AST 53*  ALT 120*  ALKPHOS 114  BILITOT 1.1  ALBUMIN 3.4*    Cardiac Enzymes  Recent Labs Lab 07/06/2016 1027  TROPONINI <0.03    Glucose  Recent Labs Lab 08/01/16 1708  GLUCAP 189*    Imaging Ct Angio Chest Pe W Or Wo Contrast  Result Date: 07/14/2016 CLINICAL DATA:  Left popliteal acute nonocclusive DVT on recent Doppler. Dyspnea EXAM: CT ANGIOGRAPHY CHEST WITH CONTRAST TECHNIQUE: Multidetector CT imaging of the chest was performed using the standard protocol during bolus administration of intravenous contrast. Multiplanar CT image reconstructions and MIPs were  obtained to evaluate the vascular anatomy. CONTRAST:  125 mL Isovue 370 intravenous COMPARISON:  Chest x-ray 07/07/2016 FINDINGS: Cardiovascular: Nonocclusive filling defect is present within right middle lobe segmental and subsegmental pulmonary artery, consistent with acute embolus, series 8, image number 141 through 154. Estimated RV LV ratio is 1.1. This would be compatible with right heart strain. There are no other discrete filling defects to suggest additional emboli. There are coronary artery calcifications. There is atherosclerosis of the aorta. Heart size is borderline enlarged. No large pericardial effusion. No dissection. Mediastinum/Nodes: Dilated fluid-filled esophagus. Trachea and mainstem bronchi within normal limits. No axillary adenopathy. There is bilateral hilar adenopathy, measuring up to 2.5 cm on the right. There is mediastinal adenopathy a right pretracheal lymph node measures 1.4 cm.  Ill-defined irregular left hilar and infrahilar soft tissue, with narrowing of left lower lobe pulmonary artery. On the coronal images, this measures approximately 3.9 by 2.8 cm. Soft tissue density/ mass extends into the mediastinum, anterior to the aorta and posterior to the upper aspect of left atrium. Lungs/Pleura: There are innumerable pulmonary nodules and masses throughout all lobes of the lung. Small left-sided pleural effusion. Airspace disease is present within the bilateral lower lobes and the right middle lobe and may relate to a pneumonia or combination of pneumonia and mass(es). Upper Abdomen: Imaged liver, spleen, and gallbladder are unremarkable. There is mild nodular thickening of the adrenal glands. Musculoskeletal: Bilateral gynecomastia. No lytic or blastic bone lesions. Review of the MIP images confirms the above findings. IMPRESSION: 1. Nonocclusive acute pulmonary embolus within segmental and subsegmental right middle lobe pulmonary arteries. Positive for acute PE with CTevidence of right  heart strain (RV/LV Ratio = 1.1) consistent with at least submassive (intermediate risk) PE. The presence of right heart strain has been associated with an increased risk of morbidity and mortality. 2. Innumerable lung nodules and masses, consistent with metastatic disease. Mediastinal and hilar adenopathy. Ill-defined soft tissue mass in the left hilar and infrahilar region with extension into the mediastinum could represent metastatic disease or primary lung neoplasm. 3. Ill-defined airspace disease within the bilateral lower lobes and right middle lobes may reflect combination of pneumonia and nodule/metastatic disease. 4. Fluid-filled mildly dilated esophagus, cannot rule out distal esophageal stricture. Barium swallow study may be obtained when clinically feasible. Critical Value/emergent results were called by telephone at the time of interpretation on 08/05/2016 at 9:33 pm to Dr. Dr. Ara Kussmaul who was covering for Dr. Gladstone Lighter , who verbally acknowledged these results. Electronically Signed   By: Donavan Foil M.D.   On: 07/30/2016 21:34   Dg Chest Port 1 View  Result Date: 08/01/2016 CLINICAL DATA:  Acute respiratory failure.  Lung cancer. EXAM: PORTABLE CHEST 1 VIEW COMPARISON:  CTA chest 07/11/2016. FINDINGS: Heart size is normal. A left hilar mass is again seen. Multiple bilateral pulmonary nodules are evident. A left pleural effusion is re- demonstrated. There is no acute abnormality otherwise. The visualized soft tissues and bony thorax are unremarkable. IMPRESSION: 1. Stable appearance of left hilar mass and multiple bilateral pulmonary nodules compatible with metastatic lung cancer. 2. Stable left pleural effusion. Electronically Signed   By: San Morelle M.D.   On: 08/01/2016 17:45    STUDIES:  CT Angio Chest 10/26>>Nonocclusive acute pulmonary embolus within segmental and subsegmental right middle lobe pulmonary arteries. Positive for acute PE with CTevidence of right  heart strain (RV/LV Ratio = 1.1) consistent with at least submassive (intermediate risk) PE. The presence of right heart strain has been associated with an increased risk of morbidity and mortality. Innumerable lung nodules and masses, consistent with metastatic disease. Mediastinal and hilar adenopathy. Ill-defined soft tissue mass in the left hilar and infrahilar region with extension into the mediastinum could represent metastatic disease or primary lung neoplasm.  Ill-defined airspace disease within the bilateral lower lobes and right middle lobes may reflect combination of pneumonia and nodule/metastatic disease. Fluid-filled mildly dilated esophagus, cannot rule out distal esophageal stricture. Barium swallow study may be obtained when clinically feasible. [place] Bilateral Lower Extremities 10/26>>Positive for right popliteal nonocclusive acute appearing DVT over a short segment. Negative for left lower extremity DVT.  CULTURES: Blood 10/27>> Urine 10/27>> Sputum>>  ANTIBIOTICS: None  SIGNIFICANT EVENTS: 10/26-Pt admitted to [facility] following drug overdose and suicide attempt  10/27-Pt transferred to ICU post rapid response due to acute on chronic hypoxic respiratory failure secondary to squamous cell lung cancer and questionable flash pulmonary edema requiring Bipap.    Plan:  After extensive discussion, patient as well as family members have agreed to make the patient as comfortable as possible due to poor prognosis due to metastatic cancer.  Plan was discussed with Dr. Alva Garnet and Dr.Sood. Comfort care orders placed.      Bincy Varughese,AG-ACNP Pulmonary & Critical Care   PCCM ATTENDING ATTESTATION:  I have evaluated patient with the APP Blakeney and Varughese, reviewed database in its entirety and discussed care plan in detail. In addition, this patient was discussed on multidisciplinary rounds.   Important exam findings: Now comfortable on MSO4 infusion  Major problems addressed  by PCCM team: Very advanced metastatic lung cancer. Innumerable and extensive tumors throughout both lungs Acute PE  PLAN/REC: Cont current comfort measure PCCM will sign off. Please call if we can be of further assistance  Merton Border, MD PCCM service Mobile [phone] Pager [phone]

## 2016-08-01 NOTE — Care Management (Signed)
Patient no longer under IVC. Transferred to icu for increasing respiratory distress, hypoxia requiring continuous bipap.  He has also ruled in for submassive pulmonary embolus.  Palliative care service not available until 10/30

## 2016-08-01 NOTE — Progress Notes (Addendum)
[place] for Heparin Indication: pulmonary embolus and DVT  No Known Allergies  Patient Measurements: Height: 6' (182.9 cm) Weight: 211 lb 6.4 oz (95.9 kg) IBW/kg (Calculated) : 77.6   Vital Signs: Temp: 98 F (36.7 C) (10/27 0802) BP: 127/72 (10/27 1652) Pulse Rate: 130 (10/27 1652)  Labs:  Recent Labs  07/20/2016 1027 08/01/16 0351  HGB 15.3 12.6*  HCT 46.5 36.9*  PLT 308 170  APTT 38*  --   LABPROT 23.8*  --   INR 2.09  --   CREATININE 1.00 0.74  TROPONINI <0.03  --     Estimated Creatinine Clearance: 113.5 mL/min (by C-G formula based on SCr of 0.74 mg/dL).   Assessment: 65 yo male with acute submassive PE. Patient was on Enoxaparin '1mg'$ /kg SQ q12h for DVT/PE and then transitioned to Eliquis. Before Eliquis given patient was transferred to ICU with acute hypoxic respiratory failure.   Plan:  Baseline HL and aPTT ordered.  Last enoxaparin treatment dose given at 0619.  Will give a smaller bolus of 2000units and start heparin at 1500units per hour. Will recheck HL in 6 hours.    Nancy Fetter, PharmD Clinical Pharmacist 08/01/2016 5:46 PM

## 2016-08-01 NOTE — Progress Notes (Signed)
Took over care of pt. From Patrice Paradise., pt. Is currently comfort care. Pt. Has morphine gtt at '2mg'$  and is on a Bipap @ 50%.  Family bedside, per plan of care discussion w/ Yevonne Aline., NP and Jerl Santos., NP with family involvement will keep pt. As comfortable as possible and try to transition off Bipap to [place] if able to tolerate. Will continue to monitor pt closely.

## 2016-08-01 NOTE — Consult Note (Signed)
CH was called to say a prayer with the family and the patient. Patient was alert. Family was concerned that patient would not be saved- [place] prayed and stayed in room with family for additional 5 minutes.

## 2016-08-01 NOTE — Progress Notes (Signed)
Initial Nutrition Assessment  DOCUMENTATION CODES:   Severe malnutrition in context of acute illness/injury (likely progressing to chronic)  INTERVENTION:  -Recommend Boost Breeze po TID, each supplement provides 250 kcal and 9 grams of protein. Pt prefers this to Ensure at this time -Add Magic Cup to all meal trays (TID) -Cater to pt preferences, liberalize diet as much as possible -Psych following, palliative care consult pending. If aggressive intervention is warranted, pt would likely benefit from NG/small bore feeding tube placement for administration of TF to boost nutritional status. Will continue to assess  NUTRITION DIAGNOSIS:   Malnutrition related to acute illness, cancer and cancer related treatments as evidenced by energy intake < or equal to 50% for > or equal to 5 days, percent weight loss. GOAL:   Patient will meet greater than or equal to 90% of their needs  MONITOR:   PO intake, Supplement acceptance, Labs, Weight trends  REASON FOR ASSESSMENT:   Malnutrition Screening Tool    ASSESSMENT:   65 yo male admitted s/p intentional drug overdose with major depression.  Pt with hx of recent stage IV lung cancer diagnosis 2 months ago, completed radiation treatment 3 weeks ago. Pt was supposed to go to oncologist yesterday to discuss further treatment plan  Pt has been eating minimally for the past 4-5 weeks with significant wt loss. Pt eating bites and sips only, taking sips of Ensure. Recorded po intake since admission 0%. SLP consulted for difficulty swallowing, diet downgraded to Dysphagia II.  Pt did take few bites with SLP today.  Wife at bedside and  is very concerned regarding nutritional intake. Wife mentioned possible feeding tube to boost nutritional status.   Pt weighed 254 pounds prior to diagnosis 2 months ago, current wt 211 pounds. 16.9% wt loss  Pt very SOB on visit today, declined physical exam at this time. Pt barely able to speak with RD.   Unable to  complete Nutrition-Focused physical exam at this time.    Diet Order:  DIET DYS 2 Room service appropriate? Yes; Fluid consistency: Thin  Skin:  Reviewed, no issues  Last BM:  10/22   Labs: reviewed  Meds: NS at 75 ml/hr, remeron  Height:   Ht Readings from Last 1 Encounters:  07/27/2016 6' (1.829 m)    Weight:   Wt Readings from Last 1 Encounters:  07/14/2016 211 lb 6.4 oz (95.9 kg)    BMI:  Body mass index is 28.67 kg/m.  Estimated Nutritional Needs:   Kcal:  8485-9276 kcals  Protein:  120-145 g  Fluid:  >/= 2.4 L  EDUCATION NEEDS:   No education needs identified at this time  [place], [place], [place] [phone] Pager  [phone] Weekend/On-Call Pager

## 2016-08-02 LAB — URINE CULTURE: Culture: NO GROWTH

## 2016-08-02 MED ORDER — ACETAMINOPHEN 650 MG RE SUPP
650.0000 mg | Freq: Four times a day (QID) | RECTAL | Status: DC | PRN
  Administered 2016-08-02 (×2): 650 mg via RECTAL
  Filled 2016-08-02 (×2): qty 1

## 2016-08-06 LAB — CULTURE, BLOOD (ROUTINE X 2)
CULTURE: NO GROWTH
CULTURE: NO GROWTH

## 2016-08-06 NOTE — Consult Note (Signed)
[place] stopped by to check on family who has been by bedside all night. Informed that [place] would be stopping by to check-in. Said a prayer with the three family and patient. Patient is resting comfortably.

## 2016-08-06 NOTE — Progress Notes (Signed)
Ch was told by the outgoing on-call [facility] to follow-up with the Pt whom she had seen earlier and continue with providing [facility] services. Ch visited the Pt, Pt was asleep, but had a brief interaction with Pt's family members and was to visit with Pt again later.     August 08, 2016 1000  Clinical Encounter Type  Visited With Patient and family together  Visit Type Initial  Referral From Chaplain  Consult/Referral To Chaplain  Spiritual Encounters  Spiritual Needs Prayer

## 2016-08-06 NOTE — Progress Notes (Signed)
[facility] contacted, reference # [id number], per representative  patient is not potential donor.

## 2016-08-06 NOTE — Plan of Care (Signed)
Problem: Education: Goal: Knowledge of [facility] Education information/materials will improve Outcome: Progressing BP 115/75   Pulse (!) 118   Temp (!) 101.8 F (38.8 C) (Axillary)   Resp 12   Ht 6' (1.829 m)   Wt 95.9 kg (211 lb 6.4 oz)   SpO2 93%   BMI 28.67 kg/m   Cont on comfort care  Foley in place

## 2016-08-06 NOTE — Progress Notes (Signed)
Contacted MD and nursing supervisor to let them  know that patient had passed at 5:27 pm.

## 2016-08-06 NOTE — Discharge Summary (Signed)
[facility] [place] NAME: Douglas Shields    MR#:  [id number]  DATE OF BIRTH:  November 21, 1950  DATE OF ADMISSION:  08/01/2016 ADMITTING PHYSICIAN: Gladstone Lighter, MD  DATE OF DISCHARGE: No discharge date for patient encounter.  PRIMARY CARE PHYSICIAN: KLIPSTEIN,CHRISTOPHER, MD     ADMISSION DIAGNOSIS:  Swelling [R60.9] Paroxysmal atrial fibrillation (HCC) [I48.0] Intentional drug overdose, initial encounter ([place]) [T50.902A] Malignant neoplasm of lung, unspecified laterality, unspecified part of lung ([place]) [C34.90]  DISCHARGE DIAGNOSIS:  Principal Problem:   Adjustment disorder with mixed disturbance of emotions and conduct Active Problems:   Overdose   Suicide attempt   Chronic pain syndrome   Protein-calorie malnutrition, severe   SECONDARY DIAGNOSIS:   Past Medical History:  Diagnosis Date  . Lung cancer ([place])   . Trigeminal neuralgia     .pro HOSPITAL COURSE:   The patient is 65 year old male with PMH of smoking , trigeminal neuralgia, recent diagnosis of lung cancer,  just finished radiation, who  presented to hospital after he overdosed on extended release morphine. The patient was drowsy and was in a. Fib RVR rate 170. Labs were unremarkable. Chest xray showed bilateral pulmonary nodules, consistent with metastatic disease. Venous US showed DVT and CTA of chest revealed acute PE. The patient was initiated on heparin IV drip and admitted. He deteriorated and was transferred to CCU. He was seen by psychiatrist and pulmonologist/intensivist. They discussed further care and the patient and family decided on comfort care measures, morphine IV drip was initiatied and the patient passed shortly after. Discussion by problem. 1. Acute respiratory failure with hypoxia due to lung cancer, PE, initiated on comfort care measures, started on morphine IV drip and expired shortly after.  2.  Afib with rvr- secondary to right heart  strain from his PE - ECHO was normal, telemetry was discontinued  3 Lung cancer- followed at [facility]< bilateral lung nodules S/p radiation, Poor prognosis, initiated on comfort care measures    4 Drug overdose- was on involuntary commitment, lifted off by psych   antidepressants were recommended,but not continued as patient was initiated on comfort care.   5 Head lice- permethrin trt was completed.  6. Fever, likely due to cancer and DVT/PE, supportive therapy, Tylenol rectally. DISCHARGE CONDITIONS:   The patient died  CONSULTS OBTAINED:  Treatment Team:  Gonzella Lex, MD  DRUG ALLERGIES:  No Known Allergies  DISCHARGE MEDICATIONS:   Current Discharge Medication List    CONTINUE these medications which have NOT CHANGED   Details  albuterol (PROVENTIL HFA;VENTOLIN HFA) 108 (90 Base) MCG/ACT inhaler Inhale 2 puffs into the lungs every 4 (four) hours as needed.    carbamazepine (TEGRETOL) 200 MG tablet Take 1 tablet by mouth 3 (three) times daily.    DPH-Lido-AlHydr-MgHydr-Simeth (FIRST-MOUTHWASH BLM MT) 10 mLs by Mouth Rinse route 4 (four) times daily as needed.    morphine (MS CONTIN) 15 MG 12 hr tablet Take 15 mg by mouth 3 (three) times daily.    omeprazole (PRILOSEC) 20 MG capsule Take 20 mg by mouth 2 (two) times daily.    oxyCODONE-acetaminophen (PERCOCET/ROXICET) 5-325 MG tablet Take 1 tablet by mouth daily as needed for severe pain.    sertraline (ZOLOFT) 100 MG tablet Take 1 tablet by mouth daily.    tamsulosin (FLOMAX) 0.4 MG CAPS capsule Take 2 capsules by mouth at bedtime.         DISCHARGE INSTRUCTIONS:    No follow up  If you experience worsening of your admission symptoms, develop shortness of breath, life threatening emergency, suicidal or homicidal thoughts you must seek medical attention immediately by calling 911 or calling your MD immediately  if symptoms less severe.  You Must read complete instructions/literature along with all the  possible adverse reactions/side effects for all the Medicines you take and that have been prescribed to you. Take any new Medicines after you have completely understood and accept all the possible adverse reactions/side effects.   Please note  You were cared for by a hospitalist during your hospital stay. If you have any questions about your discharge medications or the care you received while you were in the hospital after you are discharged, you can call the unit and asked to speak with the hospitalist on call if the hospitalist that took care of you is not available. Once you are discharged, your primary care physician will handle any further medical issues. Please note that NO REFILLS for any discharge medications will be authorized once you are discharged, as it is imperative that you return to your primary care physician (or establish a relationship with a primary care physician if you do not have one) for your aftercare needs so that they can reassess your need for medications and monitor your lab values.    Today   CHIEF COMPLAINT:   Chief Complaint  Patient presents with  . Drug Overdose  . Suicide Attempt    HISTORY OF PRESENT ILLNESS:  Douglas Shields  is a 65 y.o. male with a known history of smoking , trigeminal neuralgia, recent diagnosis of lung cancer,  just finished radiation, who  presented to hospital after he overdosed on extended release morphine. The patient was drowsy and was in a. Fib RVR rate 170. Labs were unremarkable. Chest xray showed bilateral pulmonary nodules, consistent with metastatic disease. Venous US showed DVT and CTA of chest revealed acute PE. The patient was initiated on heparin IV drip and admitted. He deteriorated and was transferred to CCU. He was seen by psychiatrist and pulmonologist/intensivist. They discussed further care and the patient and family decided on comfort care measures, morphine IV drip was initiatied and the patient passed shortly  after. Discussion by problem. 1. Acute respiratory failure with hypoxia due to lung cancer, PE, initiated on comfort care measures, started on morphine IV drip and expired shortly after.  2.  Afib with rvr- secondary to right heart strain from his PE - ECHO was normal, telemetry was discontinued  3 Lung cancer- followed at [facility]< bilateral lung nodules S/p radiation, Poor prognosis, initiated on comfort care measures    4 Drug overdose- was on involuntary commitment, lifted off by psych   antidepressants were recommended,but not continued as patient was initiated on comfort care.   5 Head lice- permethrin trt was completed.  6. Fever, likely due to cancer and DVT/PE, supportive therapy, Tylenol rectally.    VITAL SIGNS:  Blood pressure 115/75, pulse (!) 118, temperature (!) 101.8 F (38.8 C), temperature source Axillary, resp. rate 12, height 6' (1.829 m), weight 95.9 kg (211 lb 6.4 oz), SpO2 93 %.  I/O:   Intake/Output Summary (Last 24 hours) at 2016-08-12 1924 Last data filed at Aug 12, 2016 1100  Gross per 24 hour  Intake            155.4 ml  Output             1115 ml  Net           -  959.6 ml    PHYSICAL EXAMINATION:  GENERAL:  65 y.o.-year-old patient lying in the bed in moderate respiratory distress , tachypneic,  with apnea episodes.  EYES: Pupils equal, round, reactive to light and accommodation. No scleral icterus. Extraocular muscles intact.  HEENT: Head atraumatic, normocephalic. Oropharynx and nasopharynx clear.  NECK:  Supple, no jugular venous distention. No thyroid enlargement, no tenderness.  LUNGS: Normal breath sounds bilaterally, no wheezing, rales,rhonchi or crepitation. No use of accessory muscles of respiration.  CARDIOVASCULAR: S1, S2 normal. No murmurs, rubs, or gallops.  ABDOMEN: Soft, non-tender, non-distended. Bowel sounds present. No organomegaly or mass.  EXTREMITIES: No pedal edema, cyanosis, or clubbing.  NEUROLOGIC: Cranial nerves II through XII  unable to evaluate. Muscle strength not moving extremities. Sensation not able to assess. Gait not checked.  PSYCHIATRIC: The patient is comatose, no opening eyes, does not follow commands, nonverbal.  SKIN: No obvious rash, lesion, or ulcer.   DATA REVIEW:   CBC  Recent Labs Lab 08/01/16 1736  WBC 9.1  HGB 13.2  HCT 38.0*  PLT 173    Chemistries   Recent Labs Lab 08/03/2016 1027  08/01/16 1736  NA 135  < > 138  K 3.7  < > 3.7  CL 94*  < > 99*  CO2 25  < > 24  GLUCOSE 222*  < > 206*  BUN 20  < > 21*  CREATININE 1.00  < > 0.84  CALCIUM 9.6  < > 9.0  AST 53*  --   --   ALT 120*  --   --   ALKPHOS 114  --   --   BILITOT 1.1  --   --   < > = values in this interval not displayed.  Cardiac Enzymes  Recent Labs Lab 07/19/2016 1027  TROPONINI <0.03    Microbiology Results  Results for orders placed or performed during the hospital encounter of 07/20/2016  MRSA PCR Screening     Status: None   Collection Time: 08/01/16  5:27 PM  Result Value Ref Range Status   MRSA by PCR NEGATIVE NEGATIVE Final    Comment:        The GeneXpert MRSA Assay (FDA approved for NASAL specimens only), is one component of a comprehensive MRSA colonization surveillance program. It is not intended to diagnose MRSA infection nor to guide or monitor treatment for MRSA infections.   CULTURE, BLOOD (ROUTINE X 2) w Reflex to ID Panel     Status: None (Preliminary result)   Collection Time: 08/01/16  5:36 PM  Result Value Ref Range Status   Specimen Description BLOOD RIGHT AC  Final   Special Requests BOTTLES DRAWN AEROBIC AND ANAEROBIC 10CC  Final   Culture NO GROWTH < 24 HOURS  Final   Report Status PENDING  Incomplete  CULTURE, BLOOD (ROUTINE X 2) w Reflex to ID Panel     Status: None (Preliminary result)   Collection Time: 08/01/16  5:36 PM  Result Value Ref Range Status   Specimen Description BLOOD LEFT HAND  Final   Special Requests BOTTLES DRAWN AEROBIC AND ANAEROBIC 10CC  Final    Culture NO GROWTH < 24 HOURS  Final   Report Status PENDING  Incomplete  Culture, Urine     Status: None   Collection Time: 08/01/16  5:37 PM  Result Value Ref Range Status   Specimen Description URINE, CATHETERIZED  Final   Special Requests Immunocompromised  Final   Culture NO GROWTH Performed at [facility]  [facility]   Final   Report Status Aug 18, 2016 FINAL  Final    RADIOLOGY:  Ct Angio Chest Pe W Or Wo Contrast  Result Date: 07/21/2016 CLINICAL DATA:  Left popliteal acute nonocclusive DVT on recent Doppler. Dyspnea EXAM: CT ANGIOGRAPHY CHEST WITH CONTRAST TECHNIQUE: Multidetector CT imaging of the chest was performed using the standard protocol during bolus administration of intravenous contrast. Multiplanar CT image reconstructions and MIPs were obtained to evaluate the vascular anatomy. CONTRAST:  125 mL Isovue 370 intravenous COMPARISON:  Chest x-ray 07/25/2016 FINDINGS: Cardiovascular: Nonocclusive filling defect is present within right middle lobe segmental and subsegmental pulmonary artery, consistent with acute embolus, series 8, image number 141 through 154. Estimated RV LV ratio is 1.1. This would be compatible with right heart strain. There are no other discrete filling defects to suggest additional emboli. There are coronary artery calcifications. There is atherosclerosis of the aorta. Heart size is borderline enlarged. No large pericardial effusion. No dissection. Mediastinum/Nodes: Dilated fluid-filled esophagus. Trachea and mainstem bronchi within normal limits. No axillary adenopathy. There is bilateral hilar adenopathy, measuring up to 2.5 cm on the right. There is mediastinal adenopathy a right pretracheal lymph node measures 1.4 cm. Ill-defined irregular left hilar and infrahilar soft tissue, with narrowing of left lower lobe pulmonary artery. On the coronal images, this measures approximately 3.9 by 2.8 cm. Soft tissue density/ mass extends into the mediastinum, anterior to the  aorta and posterior to the upper aspect of left atrium. Lungs/Pleura: There are innumerable pulmonary nodules and masses throughout all lobes of the lung. Small left-sided pleural effusion. Airspace disease is present within the bilateral lower lobes and the right middle lobe and may relate to a pneumonia or combination of pneumonia and mass(es). Upper Abdomen: Imaged liver, spleen, and gallbladder are unremarkable. There is mild nodular thickening of the adrenal glands. Musculoskeletal: Bilateral gynecomastia. No lytic or blastic bone lesions. Review of the MIP images confirms the above findings. IMPRESSION: 1. Nonocclusive acute pulmonary embolus within segmental and subsegmental right middle lobe pulmonary arteries. Positive for acute PE with CTevidence of right heart strain (RV/LV Ratio = 1.1) consistent with at least submassive (intermediate risk) PE. The presence of right heart strain has been associated with an increased risk of morbidity and mortality. 2. Innumerable lung nodules and masses, consistent with metastatic disease. Mediastinal and hilar adenopathy. Ill-defined soft tissue mass in the left hilar and infrahilar region with extension into the mediastinum could represent metastatic disease or primary lung neoplasm. 3. Ill-defined airspace disease within the bilateral lower lobes and right middle lobes may reflect combination of pneumonia and nodule/metastatic disease. 4. Fluid-filled mildly dilated esophagus, cannot rule out distal esophageal stricture. Barium swallow study may be obtained when clinically feasible. Critical Value/emergent results were called by telephone at the time of interpretation on 07/09/2016 at 9:33 pm to Dr. Dr. Ara Kussmaul who was covering for Dr. Gladstone Lighter , who verbally acknowledged these results. Electronically Signed   By: Donavan Foil M.D.   On: 07/22/2016 21:34   Dg Chest Port 1 View  Result Date: 08/01/2016 CLINICAL DATA:  Acute respiratory failure.  Lung  cancer. EXAM: PORTABLE CHEST 1 VIEW COMPARISON:  CTA chest 07/22/2016. FINDINGS: Heart size is normal. A left hilar mass is again seen. Multiple bilateral pulmonary nodules are evident. A left pleural effusion is re- demonstrated. There is no acute abnormality otherwise. The visualized soft tissues and bony thorax are unremarkable. IMPRESSION: 1. Stable appearance of left hilar mass and multiple bilateral pulmonary nodules compatible  with metastatic lung cancer. 2. Stable left pleural effusion. Electronically Signed   By: San Morelle M.D.   On: 08/01/2016 17:45    EKG:   Orders placed or performed during the hospital encounter of 08/01/2016  . ED EKG  . ED EKG  . EKG 12-Lead  . EKG 12-Lead  . EKG 12-Lead  . EKG 12-Lead  . EKG 12-Lead  . EKG 12-Lead  . EKG 12-Lead  . EKG 12-Lead      Management plans discussed with the patient, family and they are in agreement.  CODE STATUS:     Code Status Orders        Start     Ordered   08/01/16 1816  Do not attempt resuscitation (DNR)  Continuous    Question Answer Comment  In the event of cardiac or respiratory ARREST Do not call a "code blue"   In the event of cardiac or respiratory ARREST Do not perform Intubation, CPR, defibrillation or ACLS   In the event of cardiac or respiratory ARREST Use medication by any route, position, wound care, and other measures to relive pain and suffering. May use oxygen, suction and manual treatment of airway obstruction as needed for comfort.      08/01/16 1818    Code Status History    Date Active Date Inactive Code Status Order ID Comments User Context   07/14/2016  4:44 PM 08/01/2016  6:18 PM Full Code [id number]  Gladstone Lighter, MD Inpatient    Advance Directive Documentation   Flowsheet Row Most Recent Value  Type of Advance Directive  Living will  Pre-existing out of facility DNR order (yellow form or pink MOST form)  No data  "MOST" Form in Place?  No data      TOTAL TIME  TAKING CARE OF THIS PATIENT: 40 minutes.    Theodoro Grist M.D on 2016/08/31 at 7:24 PM  Between 7am to 6pm - Pager - [phone]  After 6pm go to [email or web address] - password [email or web address] [facility]  Office  [phone]  CC: Primary care physician; Jacklynn Barnacle, MD

## 2016-08-06 NOTE — Progress Notes (Signed)
Pt. VSS and pt. Resting comfortably with family bedside.  Gave PRN medicine regularlyy and pt is still on morphine gtt. Family refused repositioning of pt. As pt. Was resting comfortably.

## 2016-08-06 NOTE — Progress Notes (Signed)
[facility] [place] NAME: Douglas Shields    MR#:  [id number]  DATE OF BIRTH:  January 06, 1951  SUBJECTIVE:  CHIEF COMPLAINT:   Chief Complaint  Patient presents with  . Drug Overdose  . Suicide Attempt   The patient had rapid worsening in his respiratory status, transferred to critical care unit, after discussion with critical care physician and nursing staff, patient's family and patient decided on comfort care measures, morphine was initiated intravenously as IV drip. Patient is very somnolent, not able to review systems -  REVIEW OF SYSTEMS:  Review of Systems  Unable to perform ROS: Severe respiratory distress    DRUG ALLERGIES:  No Known Allergies  VITALS:  Blood pressure 115/75, pulse (!) 118, temperature (!) 101.8 F (38.8 C), temperature source Axillary, resp. rate 12, height 6' (1.829 m), weight 95.9 kg (211 lb 6.4 oz), SpO2 93 %.  PHYSICAL EXAMINATION:  Physical Exam  GENERAL:  65 y.o.-year-old patient sitting in the bed, tachypneic, increased work of breathing. Critically ill appearing , uncomfortable, dyspneic, despite morphine IV drip EYES: Pupils equal, round, reactive to light and accommodation. No scleral icterus. Extraocular muscles intact.  HEENT: Head atraumatic, normocephalic. Oropharynx and nasopharynx clear. Placed on Bipap mask. NECK:  Supple, no jugular venous distention. No thyroid enlargement, no tenderness.  LUNGS: coarse breath sounds bilaterally, no rales,rhonchi or crepitation. Using accessory muscles of respiration.  Decreased bibasilar breath sounds. Prolonged apnea episodes CARDIOVASCULAR: S1, S2 normal. No murmurs, rubs, or gallops.  ABDOMEN: Soft, nontender, nondistended. Bowel sounds present. No organomegaly or mass.  EXTREMITIES: No pedal edema, cyanosis, or clubbing. 2+ bilateral lower extremity edema NEUROLOGIC: Not following commands. Unable to do complete neuro exam, patient is  comatose PSYCHIATRIC: The patient is comatose SKIN: No obvious rash, lesion, or ulcer.    LABORATORY PANEL:   CBC  Recent Labs Lab 08/01/16 1736  WBC 9.1  HGB 13.2  HCT 38.0*  PLT 173   ------------------------------------------------------------------------------------------------------------------  Chemistries   Recent Labs Lab 07/12/2016 1027  08/01/16 1736  NA 135  < > 138  K 3.7  < > 3.7  CL 94*  < > 99*  CO2 25  < > 24  GLUCOSE 222*  < > 206*  BUN 20  < > 21*  CREATININE 1.00  < > 0.84  CALCIUM 9.6  < > 9.0  AST 53*  --   --   ALT 120*  --   --   ALKPHOS 114  --   --   BILITOT 1.1  --   --   < > = values in this interval not displayed. ------------------------------------------------------------------------------------------------------------------  Cardiac Enzymes  Recent Labs Lab 07/28/2016 1027  TROPONINI <0.03   ------------------------------------------------------------------------------------------------------------------  RADIOLOGY:  Ct Angio Chest Pe W Or Wo Contrast  Result Date: 08/05/2016 CLINICAL DATA:  Left popliteal acute nonocclusive DVT on recent Doppler. Dyspnea EXAM: CT ANGIOGRAPHY CHEST WITH CONTRAST TECHNIQUE: Multidetector CT imaging of the chest was performed using the standard protocol during bolus administration of intravenous contrast. Multiplanar CT image reconstructions and MIPs were obtained to evaluate the vascular anatomy. CONTRAST:  125 mL Isovue 370 intravenous COMPARISON:  Chest x-ray 08/01/2016 FINDINGS: Cardiovascular: Nonocclusive filling defect is present within right middle lobe segmental and subsegmental pulmonary artery, consistent with acute embolus, series 8, image number 141 through 154. Estimated RV LV ratio is 1.1. This would be compatible with right heart strain. There are no other discrete filling defects to suggest additional  emboli. There are coronary artery calcifications. There is atherosclerosis of the aorta.  Heart size is borderline enlarged. No large pericardial effusion. No dissection. Mediastinum/Nodes: Dilated fluid-filled esophagus. Trachea and mainstem bronchi within normal limits. No axillary adenopathy. There is bilateral hilar adenopathy, measuring up to 2.5 cm on the right. There is mediastinal adenopathy a right pretracheal lymph node measures 1.4 cm. Ill-defined irregular left hilar and infrahilar soft tissue, with narrowing of left lower lobe pulmonary artery. On the coronal images, this measures approximately 3.9 by 2.8 cm. Soft tissue density/ mass extends into the mediastinum, anterior to the aorta and posterior to the upper aspect of left atrium. Lungs/Pleura: There are innumerable pulmonary nodules and masses throughout all lobes of the lung. Small left-sided pleural effusion. Airspace disease is present within the bilateral lower lobes and the right middle lobe and may relate to a pneumonia or combination of pneumonia and mass(es). Upper Abdomen: Imaged liver, spleen, and gallbladder are unremarkable. There is mild nodular thickening of the adrenal glands. Musculoskeletal: Bilateral gynecomastia. No lytic or blastic bone lesions. Review of the MIP images confirms the above findings. IMPRESSION: 1. Nonocclusive acute pulmonary embolus within segmental and subsegmental right middle lobe pulmonary arteries. Positive for acute PE with CTevidence of right heart strain (RV/LV Ratio = 1.1) consistent with at least submassive (intermediate risk) PE. The presence of right heart strain has been associated with an increased risk of morbidity and mortality. 2. Innumerable lung nodules and masses, consistent with metastatic disease. Mediastinal and hilar adenopathy. Ill-defined soft tissue mass in the left hilar and infrahilar region with extension into the mediastinum could represent metastatic disease or primary lung neoplasm. 3. Ill-defined airspace disease within the bilateral lower lobes and right middle  lobes may reflect combination of pneumonia and nodule/metastatic disease. 4. Fluid-filled mildly dilated esophagus, cannot rule out distal esophageal stricture. Barium swallow study may be obtained when clinically feasible. Critical Value/emergent results were called by telephone at the time of interpretation on 07/08/2016 at 9:33 pm to Dr. Dr. Ara Kussmaul who was covering for Dr. Gladstone Lighter , who verbally acknowledged these results. Electronically Signed   By: Donavan Foil M.D.   On: 07/25/2016 21:34   US Venous Img Lower Bilateral  Result Date: 07/30/2016 CLINICAL DATA:  Bilateral lower extremity acute swelling for 1 day. EXAM: BILATERAL LOWER EXTREMITY VENOUS DOPPLER ULTRASOUND TECHNIQUE: Gray-scale sonography with graded compression, as well as color Doppler and duplex ultrasound were performed to evaluate the lower extremity deep venous systems from the level of the common femoral vein and including the common femoral, femoral, profunda femoral, popliteal and calf veins including the posterior tibial, peroneal and gastrocnemius veins when visible. The superficial great saphenous vein was also interrogated. Spectral Doppler was utilized to evaluate flow at rest and with distal augmentation maneuvers in the common femoral, femoral and popliteal veins. COMPARISON:  None. FINDINGS: RIGHT LOWER EXTREMITY Common Femoral Vein: No evidence of thrombus. Normal compressibility, respiratory phasicity and response to augmentation. Saphenofemoral Junction: No evidence of thrombus. Normal compressibility and flow on color Doppler imaging. Profunda Femoral Vein: No evidence of thrombus. Normal compressibility and flow on color Doppler imaging. Femoral Vein: No evidence of thrombus. Normal compressibility, respiratory phasicity and response to augmentation. Popliteal Vein: Positive for hypoechoic intraluminal nonocclusive thrombus. Vessel is noncompressible. Phasic flow remains. Augmentation not performed. Calf  Veins: No evidence of thrombus. Normal compressibility and flow on color Doppler imaging. Superficial Great Saphenous Vein: No evidence of thrombus. Normal compressibility and flow on color Doppler imaging. Venous  Reflux:  None. Other Findings:  None. LEFT LOWER EXTREMITY Common Femoral Vein: No evidence of thrombus. Normal compressibility, respiratory phasicity and response to augmentation. Saphenofemoral Junction: No evidence of thrombus. Normal compressibility and flow on color Doppler imaging. Profunda Femoral Vein: No evidence of thrombus. Normal compressibility and flow on color Doppler imaging. Femoral Vein: No evidence of thrombus. Normal compressibility, respiratory phasicity and response to augmentation. Popliteal Vein: No evidence of thrombus. Normal compressibility, respiratory phasicity and response to augmentation. Calf Veins: No evidence of thrombus. Normal compressibility and flow on color Doppler imaging. Superficial Great Saphenous Vein: No evidence of thrombus. Normal compressibility and flow on color Doppler imaging. Venous Reflux:  None. Other Findings:  None. IMPRESSION: Positive for right popliteal nonocclusive acute appearing DVT over a short segment. Negative for left lower extremity DVT. These results will be called to the ordering clinician or representative by the Radiologist Assistant, and communication documented in the PACS or zVision Dashboard. Electronically Signed   By: Jerilynn Mages.  Shick M.D.   On: 07/12/2016 16:56   Dg Chest Port 1 View  Result Date: 08/01/2016 CLINICAL DATA:  Acute respiratory failure.  Lung cancer. EXAM: PORTABLE CHEST 1 VIEW COMPARISON:  CTA chest 07/15/2016. FINDINGS: Heart size is normal. A left hilar mass is again seen. Multiple bilateral pulmonary nodules are evident. A left pleural effusion is re- demonstrated. There is no acute abnormality otherwise. The visualized soft tissues and bony thorax are unremarkable. IMPRESSION: 1. Stable appearance of left hilar  mass and multiple bilateral pulmonary nodules compatible with metastatic lung cancer. 2. Stable left pleural effusion. Electronically Signed   By: San Morelle M.D.   On: 08/01/2016 17:45    EKG:   Orders placed or performed during the hospital encounter of 07/18/2016  . ED EKG  . ED EKG  . EKG 12-Lead  . EKG 12-Lead  . EKG 12-Lead  . EKG 12-Lead  . EKG 12-Lead  . EKG 12-Lead  . EKG 12-Lead  . EKG 12-Lead    ASSESSMENT AND PLAN:   Douglas Shields  is a 65 y.o. male with a known history of Smoking history, trigeminal neuralgia, recent diagnosis of lung cancer just finished radiation presents to hospital after he overdosed on extended release morphine. Noted to be in A. fib with RVR on presentation. Admitted to telemetry floor- went into acute hypoxic respiratory failure this evening  #1 Acute hypoxic hypercapnic respiratory failure- has known lung cancer- finished radiation, also acute submassive PE initial ABG showed hypercarbia and hypoxia, now on morphine IV drip, under comfort care. Continue oxygen as needed, appreciate Pulmonary input  #2 Afib with rvr- secondary to right heart strain from his PE - ECHO normal, No telemetry was recommended to be continued  #3 Lung cancer- follows at [facility]< bilateral lung nodules S/p radiation, Poor prognosis, Now on comfort care measures  Hospice care consult if patient survives until Monday  #4 Drug overdose- off involuntary commitment Appreciate psych consult  depressants were recommended, not to be continued as patient is not able to take orally  #5 Head lice- permethrin trt is completed, precautions  #6 DVT prophylaxis- heparin IV  #7. Fever, likely due to cancer and DVT/PE, supportive therapy, Tylenol, ibuprofen rectally.       All the records are reviewed and case discussed with Care Management/Social Workerr. Management plans discussed with the patient, family and they are in agreement.  CODE STATUS: Full  Code  TOTAL CRITICAL CARE TIME SPENT IN TAKING CARE OF THIS PATIENT:40 minutes .  Discussed with patient's wife and daughter, all questions were answered   Jeffren Dombek M.D on 2016/08/17 at 12:58 PM  Between 7am to 6pm - Pager - [phone]  After 6pm go to [email or web address] - password [email or web address] [facility]  Office  [phone]  CC: Primary care physician; Jacklynn Barnacle, MD

## 2016-08-06 NOTE — Progress Notes (Signed)
PT Cancellation Note  Patient Details Name: Douglas Shields MRN: [id number] DOB: [birth year]   Cancelled Treatment:    Reason Eval/Treat Not Completed: Medical issues which prohibited therapy. PT reviewed chart, patient is now on comfort care only. Will discontinue current orders. Please re-consult if family decides for more aggressive treatment/management.   Kerman Passey, PT, DPT    09-Aug-2016, 9:59 AM

## 2016-08-06 DEATH — deceased

## 2017-11-13 IMAGING — CT CT ANGIO CHEST
2 of 7 series · 17 of 46 positions shown · IV contrast (APPLIED)
Comparison: Chest x-ray 07/31/2016

CLINICAL DATA: Left popliteal acute nonocclusive DVT on recent
Doppler. Dyspnea

EXAM:
CT ANGIOGRAPHY CHEST WITH CONTRAST
TECHNIQUE: Multidetector CT imaging of the chest was performed using the
standard protocol during bolus administration of intravenous
contrast. Multiplanar CT image reconstructions and MIPs were
obtained to evaluate the vascular anatomy.
CONTRAST:  125 mL Isovue 370 intravenous

[Series 8: thins · axial · 0.80mm/px · z∈[-514,-279]mm · 15 of 265 slices shown]
[im 15/265  lung]
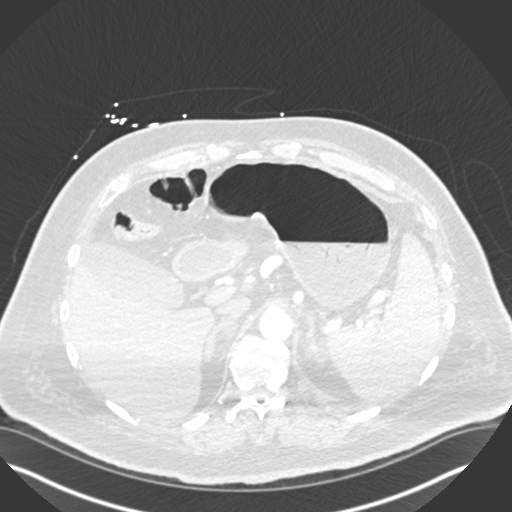
[im 30/265  soft-tissue]
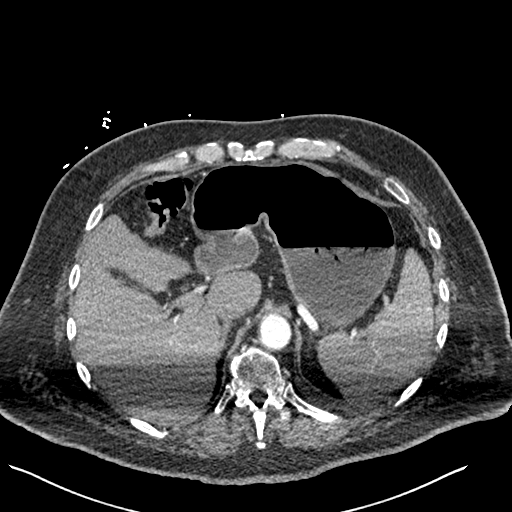
[im 45/265  lung]
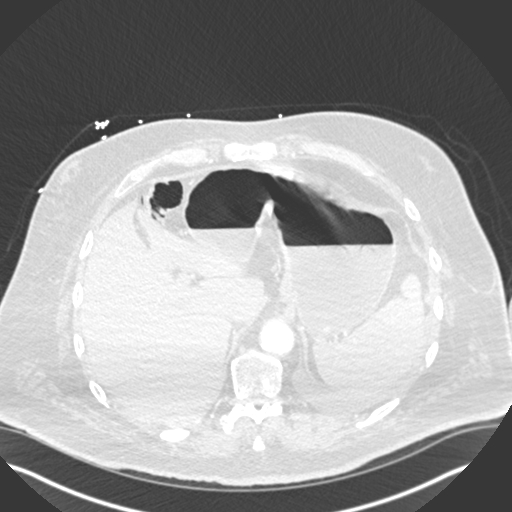
[im 59/265  soft-tissue]
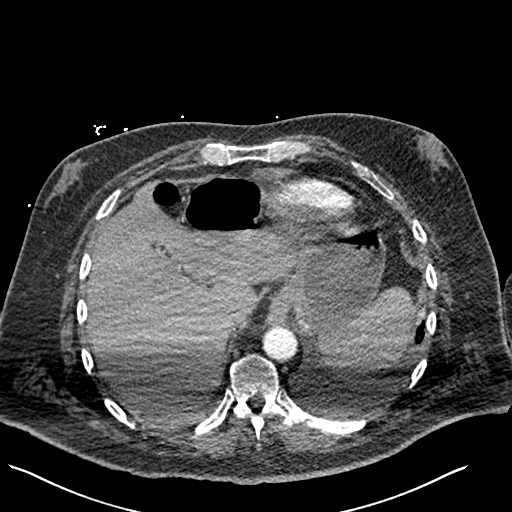
[im 89/265  lung]
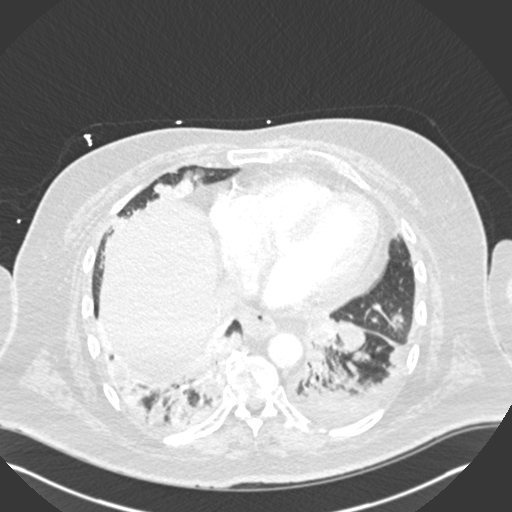
[im 103/265  soft-tissue]
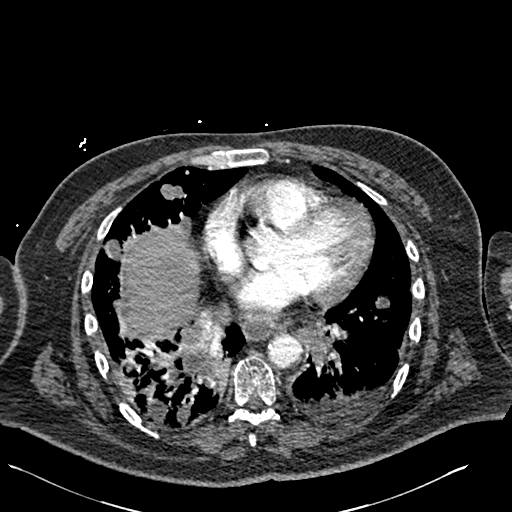
[im 118/265  lung]
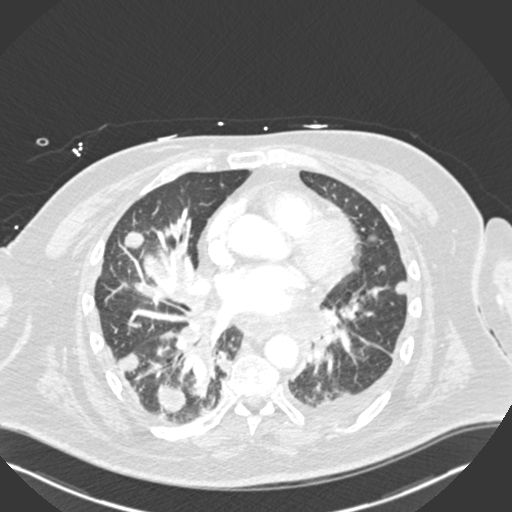
[im 133/265  soft-tissue]
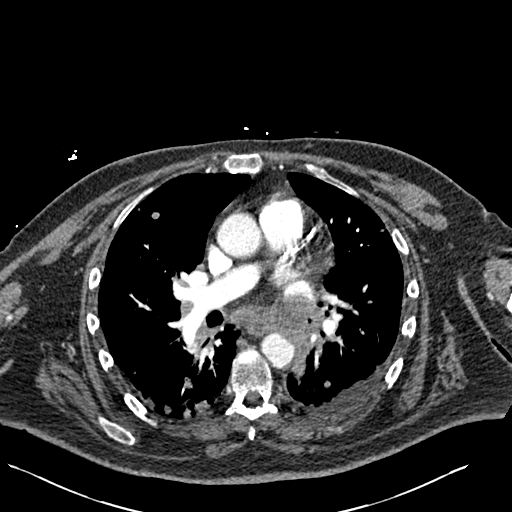
[im 147/265  lung]
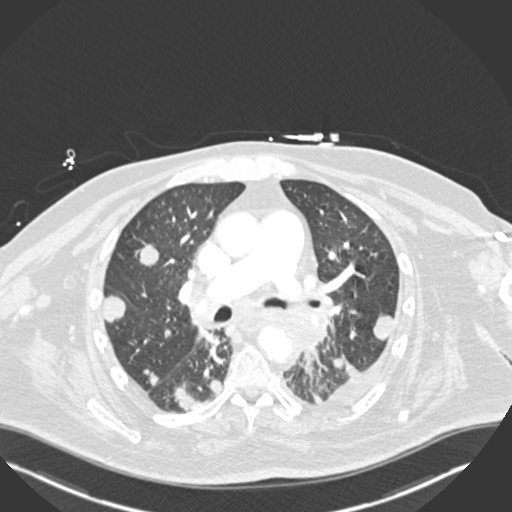
[im 162/265  soft-tissue]
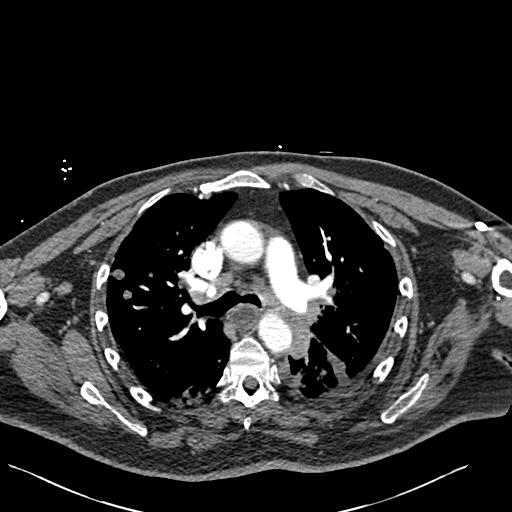
[im 177/265  lung]
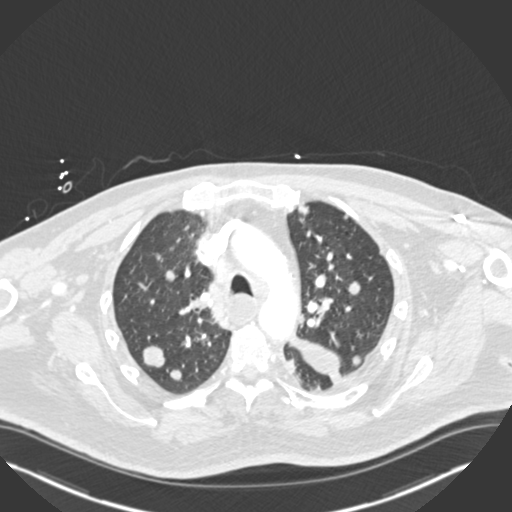
[im 206/265  soft-tissue]
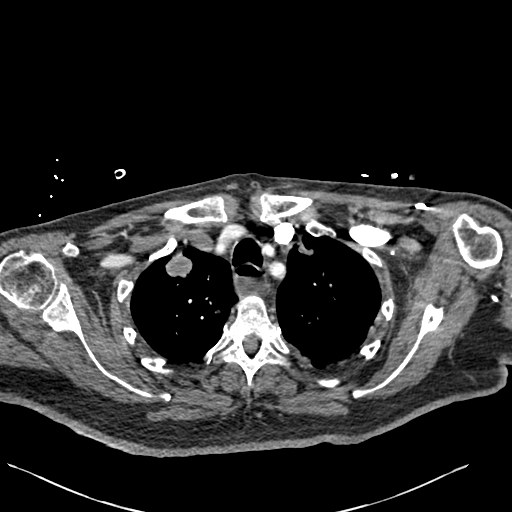
[im 221/265  lung]
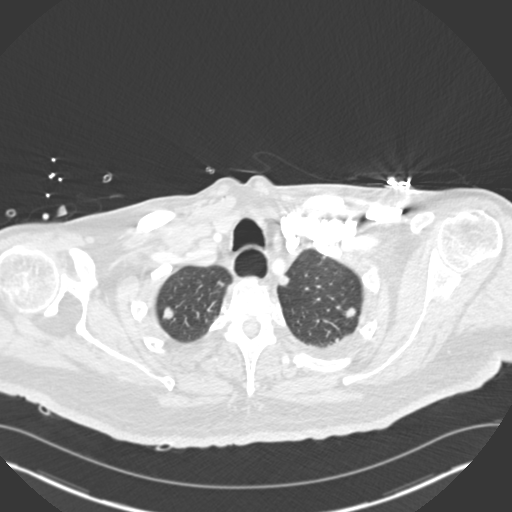
[im 235/265  soft-tissue]
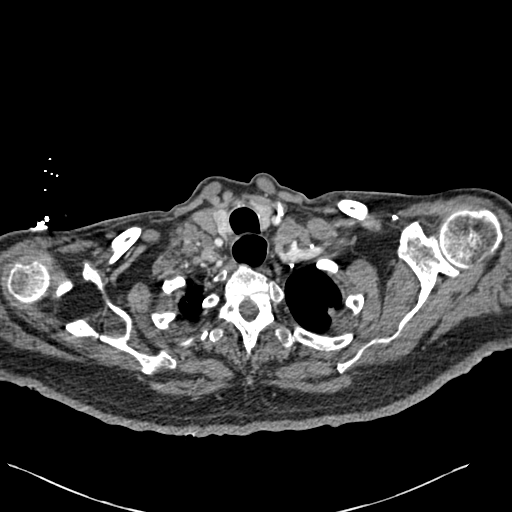
[im 250/265  lung]
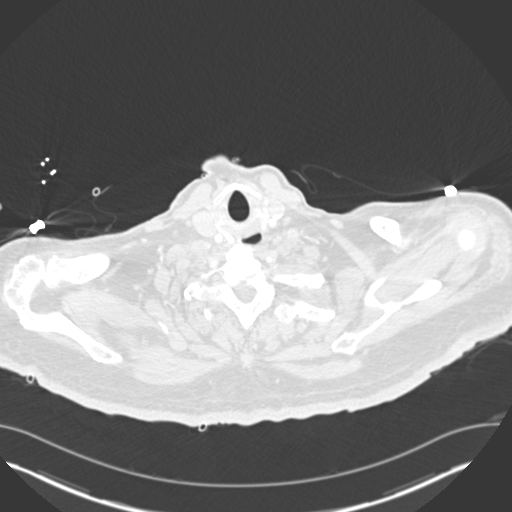

[Series 14: coronal mpr · coronal · 0.55mm/px · 2 of 93 slices shown]
[im 31/93  soft-tissue]
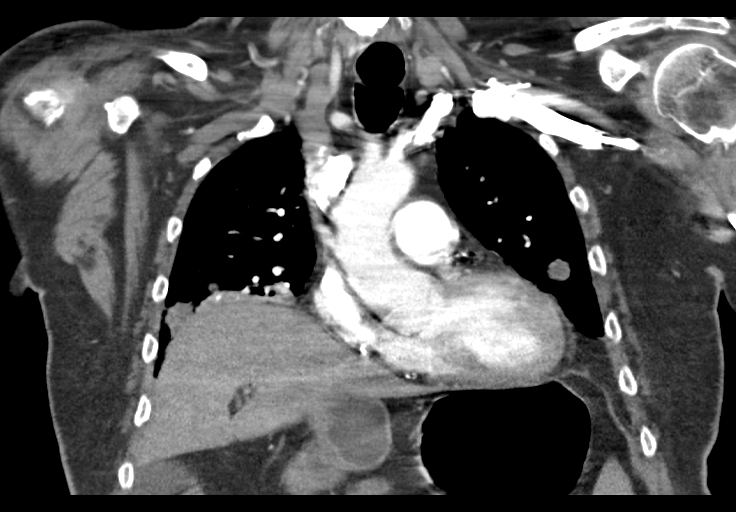
[im 62/93  soft-tissue]
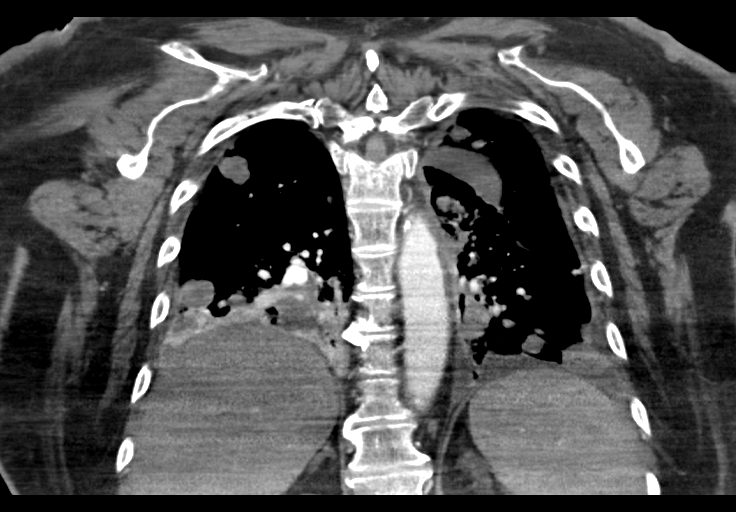

[17 of 46 positions shown; findings below may reference images not displayed]

FINDINGS: Cardiovascular: Nonocclusive filling defect is present within right
middle lobe segmental and subsegmental pulmonary artery, consistent
with acute embolus, series 8, image number 141 through 154.
Estimated RV LV ratio is 1.1. This would be compatible with right
heart strain.

There are no other discrete filling defects to suggest additional
emboli. There are coronary artery calcifications. There is
atherosclerosis of the aorta. Heart size is borderline enlarged. No
large pericardial effusion. No dissection.

Mediastinum/Nodes: Dilated fluid-filled esophagus. Trachea and
mainstem bronchi within normal limits. No axillary adenopathy. There
is bilateral hilar adenopathy, measuring up to 2.5 cm on the right.
There is mediastinal adenopathy a right pretracheal lymph node
measures 1.4 cm. Ill-defined irregular left hilar and infrahilar
soft tissue, with narrowing of left lower lobe pulmonary artery. On
the coronal images, this measures approximately 3.9 by 2.8 cm. Soft
tissue density/ mass extends into the mediastinum, anterior to the
aorta and posterior to the upper aspect of left atrium.

Lungs/Pleura: There are innumerable pulmonary nodules and masses
throughout all lobes of the lung. Small left-sided pleural effusion.
Airspace disease is present within the bilateral lower lobes and the
right middle lobe and may relate to a pneumonia or combination of
pneumonia and mass(es).

Upper Abdomen: Imaged liver, spleen, and gallbladder are
unremarkable. There is mild nodular thickening of the adrenal
glands.

Musculoskeletal: Bilateral gynecomastia. No lytic or blastic bone
lesions.

Review of the MIP images confirms the above findings.
IMPRESSION: 1. Nonocclusive acute pulmonary embolus within segmental and
subsegmental right middle lobe pulmonary arteries. Positive for
acute PE with CTevidence of right heart strain (RV/LV Ratio = 1.1)
consistent with at least submassive (intermediate risk) PE. The
presence of right heart strain has been associated with an increased
risk of morbidity and mortality.
2. Innumerable lung nodules and masses, consistent with metastatic
disease. Mediastinal and hilar adenopathy. Ill-defined soft tissue
mass in the left hilar and infrahilar region with extension into the
mediastinum could represent metastatic disease or primary lung
neoplasm.
3. Ill-defined airspace disease within the bilateral lower lobes and
right middle lobes may reflect combination of pneumonia and
nodule/metastatic disease.
4. Fluid-filled mildly dilated esophagus, cannot rule out distal
esophageal stricture. Barium swallow study may be obtained when
clinically feasible.
Critical Value/emergent results were called by telephone at the time
of interpretation on 07/31/2016 at [DATE] to Dr. Dr. Nguyen Trung who
was covering for Dr. SANGYEUP JILL , who verbally acknowledged
these results.
